# Patient Record
Sex: Male | Born: 2000 | Race: White | Hispanic: No | Marital: Single | Smoking: Never smoker
Health system: Southern US, Community
[De-identification: ages and names within clinical notes are randomized; demographics above are authoritative.]

## PROBLEM LIST (undated history)

## (undated) DIAGNOSIS — F988 Other specified behavioral and emotional disorders with onset usually occurring in childhood and adolescence: Secondary | ICD-10-CM

---
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
## 2021-09-07 ENCOUNTER — Other Ambulatory Visit: Payer: Self-pay

## 2021-09-07 ENCOUNTER — Observation Stay
Admission: EM | Admit: 2021-09-07 | Discharge: 2021-09-08 | Disposition: A | Discharge status: Home / Self Care | Payer: Medicaid Other | Attending: Surgery | Admitting: Surgery

## 2021-09-07 ENCOUNTER — Encounter
Admission: EM | Disposition: A | Discharge status: Home / Self Care | Payer: Self-pay | Source: Home / Self Care | Attending: Emergency Medicine

## 2021-09-07 ENCOUNTER — Emergency Department: Payer: Medicaid Other | Admitting: Anesthesiology

## 2021-09-07 ENCOUNTER — Emergency Department: Payer: Medicaid Other

## 2021-09-07 DIAGNOSIS — K3580 Unspecified acute appendicitis: Secondary | ICD-10-CM | POA: Diagnosis present

## 2021-09-07 HISTORY — PX: XI ROBOTIC LAPAROSCOPIC ASSISTED APPENDECTOMY: SHX6877

## 2021-09-07 LAB — COMPREHENSIVE METABOLIC PANEL
ALT: 13 U/L (ref 0–44)
AST: 20 U/L (ref 15–41)
Albumin: 4.8 g/dL (ref 3.5–5.0)
Alkaline Phosphatase: 88 U/L (ref 38–126)
Anion gap: 9 (ref 5–15)
BUN: 14 mg/dL (ref 6–20)
CO2: 25 mmol/L (ref 22–32)
Calcium: 9.5 mg/dL (ref 8.9–10.3)
Chloride: 101 mmol/L (ref 98–111)
Creatinine, Ser: 0.91 mg/dL (ref 0.61–1.24)
GFR, Estimated: >60 mL/min (ref >60)
Glucose, Bld: 84 mg/dL (ref 70–99)
Potassium: 3.8 mmol/L (ref 3.5–5.1)
Sodium: 135 mmol/L (ref 135–145)
Total Bilirubin: 2.1 mg/dL — ABNORMAL HIGH (ref 0.3–1.2)
Total Protein: 8.1 g/dL (ref 6.5–8.1)

## 2021-09-07 LAB — CBC
HCT: 46.5 % (ref 39.0–52.0)
Hemoglobin: 15.6 g/dL (ref 13.0–17.0)
MCH: 27.7 pg (ref 26.0–34.0)
MCHC: 33.5 g/dL (ref 30.0–36.0)
MCV: 82.6 fL (ref 80.0–100.0)
Platelets: 272 10*3/uL (ref 150–400)
RBC: 5.63 MIL/uL (ref 4.22–5.81)
RDW: 12.1 % (ref 11.5–15.5)
WBC: 18.5 10*3/uL — ABNORMAL HIGH (ref 4.0–10.5)
nRBC: 0 % (ref 0.0–0.2)

## 2021-09-07 LAB — URINALYSIS, ROUTINE W REFLEX MICROSCOPIC
Bacteria, UA: NONE SEEN
Bilirubin Urine: NEGATIVE
Glucose, UA: NEGATIVE mg/dL
Ketones, ur: 20 mg/dL — AB
Leukocytes,Ua: NEGATIVE
Nitrite: NEGATIVE
Protein, ur: NEGATIVE mg/dL
Specific Gravity, Urine: 1.023 (ref 1.005–1.030)
Squamous Epithelial / LPF: NONE SEEN (ref 0–5)
pH: 5 (ref 5.0–8.0)

## 2021-09-07 LAB — LIPASE, BLOOD: Lipase: 25 U/L (ref 11–51)

## 2021-09-07 SURGERY — APPENDECTOMY, ROBOT-ASSISTED, LAPAROSCOPIC
Anesthesia: General

## 2021-09-07 MED ORDER — MORPHINE SULFATE (PF) 2 MG/ML IV SOLN: 1.0000 mg | INTRAVENOUS | Status: DC | PRN

## 2021-09-07 MED ORDER — FENTANYL CITRATE (PF) 100 MCG/2ML IJ SOLN
INTRAMUSCULAR | Status: AC
  Filled 2021-09-07: qty 2

## 2021-09-07 MED ORDER — IOHEXOL 300 MG/ML  SOLN
100.0000 mL | Freq: Once | INTRAMUSCULAR | Status: AC | PRN
  Administered 2021-09-07: 100 mL via INTRAVENOUS

## 2021-09-07 MED ORDER — PROPOFOL 10 MG/ML IV BOLUS
INTRAVENOUS | Status: AC
  Filled 2021-09-07: qty 20

## 2021-09-07 MED ORDER — FENTANYL CITRATE (PF) 100 MCG/2ML IJ SOLN
INTRAMUSCULAR | Status: DC | PRN
  Administered 2021-09-07 (×2): 50 ug via INTRAVENOUS

## 2021-09-07 MED ORDER — FENTANYL CITRATE (PF) 100 MCG/2ML IJ SOLN
25.0000 ug | INTRAMUSCULAR | Status: DC | PRN
  Administered 2021-09-07: 25 ug via INTRAVENOUS

## 2021-09-07 MED ORDER — ONDANSETRON HCL 4 MG/2ML IJ SOLN
INTRAMUSCULAR | Status: DC | PRN
  Administered 2021-09-07: 4 mg via INTRAVENOUS

## 2021-09-07 MED ORDER — IBUPROFEN 400 MG PO TABS: 600.0000 mg | ORAL_TABLET | Freq: Four times a day (QID) | ORAL | Status: DC | PRN

## 2021-09-07 MED ORDER — DEXAMETHASONE SODIUM PHOSPHATE 10 MG/ML IJ SOLN
INTRAMUSCULAR | Status: DC | PRN
  Administered 2021-09-07: 10 mg via INTRAVENOUS

## 2021-09-07 MED ORDER — OXYCODONE HCL 5 MG PO TABS
ORAL_TABLET | ORAL | Status: AC
  Filled 2021-09-07: qty 1

## 2021-09-07 MED ORDER — MIDAZOLAM HCL 2 MG/2ML IJ SOLN
INTRAMUSCULAR | Status: DC | PRN
  Administered 2021-09-07: 2 mg via INTRAVENOUS

## 2021-09-07 MED ORDER — PHENYLEPHRINE 40 MCG/ML (10ML) SYRINGE FOR IV PUSH (FOR BLOOD PRESSURE SUPPORT)
PREFILLED_SYRINGE | INTRAVENOUS | Status: DC | PRN
  Administered 2021-09-07 (×2): 40 ug via INTRAVENOUS
  Administered 2021-09-07: 80 ug via INTRAVENOUS

## 2021-09-07 MED ORDER — SUCCINYLCHOLINE CHLORIDE 200 MG/10ML IV SOSY
PREFILLED_SYRINGE | INTRAVENOUS | Status: DC | PRN
  Administered 2021-09-07: 120 mg via INTRAVENOUS

## 2021-09-07 MED ORDER — ONDANSETRON HCL 4 MG/2ML IJ SOLN: 4.0000 mg | Freq: Four times a day (QID) | INTRAMUSCULAR | Status: DC | PRN

## 2021-09-07 MED ORDER — ACETAMINOPHEN 325 MG PO TABS: 650.0000 mg | ORAL_TABLET | Freq: Four times a day (QID) | ORAL | Status: DC | PRN

## 2021-09-07 MED ORDER — SUGAMMADEX SODIUM 200 MG/2ML IV SOLN
INTRAVENOUS | Status: DC | PRN
  Administered 2021-09-07: 200 mg via INTRAVENOUS

## 2021-09-07 MED ORDER — ACETAMINOPHEN 10 MG/ML IV SOLN
INTRAVENOUS | Status: DC | PRN
Start: 2021-09-07 — End: 2021-09-07
  Administered 2021-09-07: 1000 mg via INTRAVENOUS

## 2021-09-07 MED ORDER — KETOROLAC TROMETHAMINE 30 MG/ML IJ SOLN
INTRAMUSCULAR | Status: DC | PRN
  Administered 2021-09-07: 30 mg via INTRAVENOUS

## 2021-09-07 MED ORDER — DEXMEDETOMIDINE (PRECEDEX) IN NS 20 MCG/5ML (4 MCG/ML) IV SYRINGE
PREFILLED_SYRINGE | INTRAVENOUS | Status: DC | PRN
  Administered 2021-09-07 (×3): 4 ug via INTRAVENOUS

## 2021-09-07 MED ORDER — LACTATED RINGERS IV SOLN: INTRAVENOUS | Status: DC

## 2021-09-07 MED ORDER — HYDROCODONE-ACETAMINOPHEN 5-325 MG PO TABS
1.0000 | ORAL_TABLET | ORAL | Status: DC | PRN
  Administered 2021-09-07 – 2021-09-08 (×2): 1 via ORAL
  Filled 2021-09-07 (×2): qty 1

## 2021-09-07 MED ORDER — LIDOCAINE-EPINEPHRINE (PF) 1 %-1:200000 IJ SOLN
INTRAMUSCULAR | Status: DC | PRN
  Administered 2021-09-07: 10 mL

## 2021-09-07 MED ORDER — CEFAZOLIN SODIUM-DEXTROSE 2-4 GM/100ML-% IV SOLN
2.0000 g | Freq: Once | INTRAVENOUS | Status: AC
  Administered 2021-09-07: 2 g via INTRAVENOUS

## 2021-09-07 MED ORDER — ONDANSETRON 4 MG PO TBDP: 4.0000 mg | ORAL_TABLET | Freq: Four times a day (QID) | ORAL | Status: DC | PRN

## 2021-09-07 MED ORDER — BUPIVACAINE HCL (PF) 0.5 % IJ SOLN
INTRAMUSCULAR | Status: DC | PRN
  Administered 2021-09-07: 10 mL

## 2021-09-07 MED ORDER — MIDAZOLAM HCL 2 MG/2ML IJ SOLN
INTRAMUSCULAR | Status: AC
  Filled 2021-09-07: qty 2

## 2021-09-07 MED ORDER — LACTATED RINGERS IV SOLN: INTRAVENOUS | Status: DC | PRN

## 2021-09-07 MED ORDER — ROCURONIUM BROMIDE 100 MG/10ML IV SOLN
INTRAVENOUS | Status: DC | PRN
  Administered 2021-09-07: 30 mg via INTRAVENOUS
  Administered 2021-09-07: 20 mg via INTRAVENOUS

## 2021-09-07 MED ORDER — OXYCODONE HCL 5 MG/5ML PO SOLN: 5.0000 mg | Freq: Once | ORAL | Status: AC | PRN

## 2021-09-07 MED ORDER — ACETAMINOPHEN 10 MG/ML IV SOLN
INTRAVENOUS | Status: AC
  Filled 2021-09-07: qty 100

## 2021-09-07 MED ORDER — TRAMADOL HCL 50 MG PO TABS
50.0000 mg | ORAL_TABLET | Freq: Four times a day (QID) | ORAL | Status: DC | PRN
  Administered 2021-09-07: 50 mg via ORAL
  Filled 2021-09-07: qty 1

## 2021-09-07 MED ORDER — LORAZEPAM 0.5 MG PO TABS
0.5000 mg | ORAL_TABLET | Freq: Once | ORAL | Status: AC
  Administered 2021-09-07: 0.5 mg via ORAL
  Filled 2021-09-07: qty 1

## 2021-09-07 MED ORDER — LIDOCAINE HCL (CARDIAC) PF 100 MG/5ML IV SOSY
PREFILLED_SYRINGE | INTRAVENOUS | Status: DC | PRN
  Administered 2021-09-07: 80 mg via INTRAVENOUS

## 2021-09-07 MED ORDER — OXYCODONE HCL 5 MG PO TABS
5.0000 mg | ORAL_TABLET | Freq: Once | ORAL | Status: AC | PRN
  Administered 2021-09-07: 5 mg

## 2021-09-07 MED ORDER — 0.9 % SODIUM CHLORIDE (POUR BTL) OPTIME
TOPICAL | Status: DC | PRN
  Administered 2021-09-07: 160 mL

## 2021-09-07 MED ORDER — PROPOFOL 10 MG/ML IV BOLUS
INTRAVENOUS | Status: DC | PRN
  Administered 2021-09-07: 120 mg via INTRAVENOUS

## 2021-09-07 SURGICAL SUPPLY — 69 items
ANCHOR TIS RET SYS 235ML (MISCELLANEOUS) ×2 IMPLANT
BAG INFUSER PRESSURE 100CC (MISCELLANEOUS) ×1 IMPLANT
BLADE SURG SZ11 CARB STEEL (BLADE) ×2 IMPLANT
BULB RESERV EVAC DRAIN JP 100C (MISCELLANEOUS) IMPLANT
CANNULA REDUC XI 12-8 STAPL (CANNULA) ×1
CANNULA REDUCER 12-8 DVNC XI (CANNULA) ×1 IMPLANT
COVER TIP SHEARS 8 DVNC (MISCELLANEOUS) ×1 IMPLANT
COVER TIP SHEARS 8MM DA VINCI (MISCELLANEOUS) ×1
DERMABOND ADVANCED (GAUZE/BANDAGES/DRESSINGS) ×1
DERMABOND ADVANCED .7 DNX12 (GAUZE/BANDAGES/DRESSINGS) ×1 IMPLANT
DRAIN CHANNEL JP 15F RND 16 (MISCELLANEOUS) IMPLANT
DRAPE ARM DVNC X/XI (DISPOSABLE) ×3 IMPLANT
DRAPE COLUMN DVNC XI (DISPOSABLE) ×1 IMPLANT
DRAPE DA VINCI XI ARM (DISPOSABLE) ×3
DRAPE DA VINCI XI COLUMN (DISPOSABLE) ×1
ELECT CAUTERY BLADE 6.4 (BLADE) IMPLANT
ELECT REM PT RETURN 9FT ADLT (ELECTROSURGICAL) ×2
ELECTRODE REM PT RTRN 9FT ADLT (ELECTROSURGICAL) ×1 IMPLANT
GLOVE SURG SYN 6.5 ES PF (GLOVE) ×8 IMPLANT
GLOVE SURG SYN 6.5 PF PI (GLOVE) ×2 IMPLANT
GLOVE SURG UNDER POLY LF SZ7 (GLOVE) ×6 IMPLANT
GOWN STRL REUS W/ TWL LRG LVL3 (GOWN DISPOSABLE) ×3 IMPLANT
GOWN STRL REUS W/TWL LRG LVL3 (GOWN DISPOSABLE) ×3
GRASPER SUT TROCAR 14GX15 (MISCELLANEOUS) ×1 IMPLANT
IRRIGATOR SUCT 8 DISP DVNC XI (IRRIGATION / IRRIGATOR) IMPLANT
IRRIGATOR SUCTION 8MM XI DISP (IRRIGATION / IRRIGATOR) ×1
IV NS 1000ML (IV SOLUTION) ×1
IV NS 1000ML BAXH (IV SOLUTION) IMPLANT
JACKSON PRATT 7MM (INSTRUMENTS) IMPLANT
KIT TURNOVER KIT A (KITS) ×2 IMPLANT
LABEL OR SOLS (LABEL) IMPLANT
MANIFOLD NEPTUNE II (INSTRUMENTS) ×2 IMPLANT
NDL INSUFFLATION 14GA 120MM (NEEDLE) ×1 IMPLANT
NEEDLE HYPO 22GX1.5 SAFETY (NEEDLE) ×2 IMPLANT
NEEDLE INSUFFLATION 14GA 120MM (NEEDLE) ×2 IMPLANT
OBTURATOR OPTICAL STANDARD 8MM (TROCAR) ×1
OBTURATOR OPTICAL STND 8 DVNC (TROCAR) ×1
OBTURATOR OPTICALSTD 8 DVNC (TROCAR) ×1 IMPLANT
PACK LAP CHOLECYSTECTOMY (MISCELLANEOUS) ×2 IMPLANT
PENCIL ELECTRO HAND CTR (MISCELLANEOUS) ×2 IMPLANT
RELOAD STAPLE 45 2.5 WHT DVNC (STAPLE) IMPLANT
RELOAD STAPLE 45 3.5 BLU DVNC (STAPLE) ×1 IMPLANT
RELOAD STAPLER 2.5X45 WHT DVNC (STAPLE) ×2 IMPLANT
RELOAD STAPLER 3.5X45 BLU DVNC (STAPLE) ×1 IMPLANT
SEAL CANN UNIV 5-8 DVNC XI (MISCELLANEOUS) ×3 IMPLANT
SEAL XI 5MM-8MM UNIVERSAL (MISCELLANEOUS) ×3
SET TUBE SMOKE EVAC HIGH FLOW (TUBING) ×2 IMPLANT
SOLUTION ELECTROLUBE (MISCELLANEOUS) ×2 IMPLANT
STAPLER 45 DA VINCI SURE FORM (STAPLE) ×1
STAPLER 45 SUREFORM DVNC (STAPLE) ×1 IMPLANT
STAPLER CANNULA SEAL DVNC XI (STAPLE) ×1 IMPLANT
STAPLER CANNULA SEAL XI (STAPLE) ×1
STAPLER RELOAD 2.5X45 WHITE (STAPLE) ×2
STAPLER RELOAD 2.5X45 WHT DVNC (STAPLE) ×2
STAPLER RELOAD 3.5X45 BLU DVNC (STAPLE) ×1
STAPLER RELOAD 3.5X45 BLUE (STAPLE) ×1
SUT ETHILON 3-0 FS-10 30 BLK (SUTURE)
SUT MNCRL 4-0 (SUTURE) ×2
SUT MNCRL 4-0 27XMFL (SUTURE) ×2
SUT MNCRL AB 4-0 PS2 18 (SUTURE) ×2 IMPLANT
SUT VIC AB 3-0 SH 27 (SUTURE) ×2
SUT VIC AB 3-0 SH 27X BRD (SUTURE) IMPLANT
SUT VIC AB 4-0 PS2 27 (SUTURE) IMPLANT
SUT VICRYL 0 AB UR-6 (SUTURE) ×2 IMPLANT
SUTURE EHLN 3-0 FS-10 30 BLK (SUTURE) IMPLANT
SUTURE MNCRL 4-0 27XMF (SUTURE) IMPLANT
SYR 30ML LL (SYRINGE) ×2 IMPLANT
SYSTEM WECK SHIELD CLOSURE (TROCAR) IMPLANT
WATER STERILE IRR 500ML POUR (IV SOLUTION) ×2 IMPLANT

## 2021-09-07 NOTE — Interval H&P Note (Signed)
History and Physical Interval Note: ? ?09/07/2021 ?3:24 PM ? ?Cory Keller  has presented today for surgery, with the diagnosis of Appendicitis.  The various methods of treatment have been discussed with the patient and family. After consideration of risks, benefits and other options for treatment, the patient has consented to  Procedure(s): ?XI ROBOTIC [place] (N/A) as a surgical intervention.  The patient's history has been reviewed, patient examined, no change in status, stable for surgery.  I have reviewed the patient's chart and labs.  Questions were answered to the patient's satisfaction.   ? ? ?Cory Keller ? ? ?

## 2021-09-07 NOTE — Anesthesia Procedure Notes (Signed)
Procedure Name: Intubation ?Date/Time: 09/07/2021 3:35 PM ?Performed by: Joanette Gula, Catera Hankins, CRNA ?Pre-anesthesia Checklist: Patient identified, Emergency Drugs available, Suction available and Patient being monitored ?Patient Re-evaluated:Patient Re-evaluated prior to induction ?Oxygen Delivery Method: Circle system utilized ?Preoxygenation: Pre-oxygenation with 100% oxygen ?Induction Type: IV induction ?Ventilation: Mask ventilation without difficulty ?Laryngoscope Size: McGraph and 4 ?Grade View: Grade I ?Tube type: Oral ?Number of attempts: 1 ?Airway Equipment and Method: Stylet ?Placement Confirmation: ETT inserted through vocal cords under direct vision, positive ETCO2 and breath sounds checked- equal and bilateral ?Secured at: 22 cm ?Tube secured with: Tape ?Dental Injury: Teeth and Oropharynx as per pre-operative assessment  ? ? ? ? ?

## 2021-09-07 NOTE — ED Provider Notes (Signed)
? ?  [facility] ?Provider Note ? ? ? Event Date/Time  ? First MD Initiated Contact with Patient 09/07/21 1137   ?  (approximate) ? ?History  ? ?Chief Complaint: Abdominal Pain ? ?HPI ? ?Cory Keller is a 21 y.o. male with no significant past medical history presents to the emergency department for 3 days of lower abdominal pain.  According to the patient for the past few days he has been experiencing pain across the lower abdomen along with diarrhea.  Denies any vomiting or fever.  No dysuria or hematuria. ? ?Physical Exam  ? ?Triage Vital Signs: ?ED Triage Vitals  ?Enc Vitals Group  ?   BP 09/07/21 1119 137/74  ?   Pulse Rate 09/07/21 1119 99  ?   Resp 09/07/21 1119 18  ?   Temp 09/07/21 1119 98.3 ?F (36.8 ?C)  ?   Temp Source 09/07/21 1119 Oral  ?   SpO2 09/07/21 1119 98 %  ?   Weight --   ?   Height --   ?   Head Circumference --   ?   Peak Flow --   ?   Pain Score 09/07/21 1124 7  ?   Pain Loc --   ?   Pain Edu? --   ?   Excl. in GC? --   ? ? ?Most recent vital signs: ?Vitals:  ? 09/07/21 1119  ?BP: 137/74  ?Pulse: 99  ?Resp: 18  ?Temp: 98.3 ?F (36.8 ?C)  ?SpO2: 98%  ? ? ?General: Awake, no distress.  ?CV:  Good peripheral perfusion.  Regular rate and rhythm  ?Resp:  Normal effort.  Equal breath sounds bilaterally.  ?Abd:  No distention.  Soft, moderate tenderness across the lower abdomen.  No rebound guarding or distention ? ? ?ED Results / Procedures / Treatments  ? ?RADIOLOGY ? ?I personally reviewed the CT images concern for possible acute appendicitis given the fluid-filled appendix what appears to be an appendix in the right lower quadrant. ? ?IMPRESSION:  ?1.  Acute appendicitis without evidence of perforation or abscess.  ? ?2.  Small volume free fluid in the low pelvis, likely reactive.  ? ? ?MEDICATIONS ORDERED IN ED: ?Medications - No data to display ? ? ?IMPRESSION / MDM / ASSESSMENT AND PLAN / ED COURSE  ?I reviewed the triage vital signs and the nursing notes. ? ?Patient  presents emergency department for 3 days of lower abdominal pain, moderate tenderness on my evaluation.  Patient does state intermittent loose stool but denies any vomiting or fever.  We will obtain lab work and a CT scan to further evaluate.  Differential would include appendicitis, colitis, diverticulitis, UTI/pyelonephritis. ? ?Patient's CBC is resulted showing moderate leukocytosis of 18,500.  Remainder the lab work is pending.  CT pending.  Patient is quite anxious appearing, we will dose small dose of Ativan once IV established so that we can obtain CT imaging. ? ?CT scan confirms acute appendicitis.  I spoke to Dr. Tonna Boehringer will be down shortly to speak to the patient after he is out of the operating room. ? ?FINAL CLINICAL IMPRESSION(S) / ED DIAGNOSES  ? ?Abdominal pain ?Acute appendicitis ? ?Note:  This document was prepared using Dragon voice recognition software and may include unintentional dictation errors. ?  Minna Antis, MD ?09/07/21 1403 ? ?

## 2021-09-07 NOTE — Anesthesia Preprocedure Evaluation (Signed)
Anesthesia Evaluation  ?Patient identified by MRN, date of birth, ID band ?Patient awake ? ? ? ?Reviewed: ?Allergy & Precautions, NPO status , Patient's Chart, lab work & pertinent test results ? ?History of Anesthesia Complications ?Negative for: history of anesthetic complications ? ?Airway ?Mallampati: II ? ?TM Distance: >3 FB ?Neck ROM: full ? ? ? Dental ? ?(+) Chipped ?  ?Pulmonary ?neg pulmonary ROS, neg shortness of breath,  ?  ?Pulmonary exam normal ? ? ? ? ? ? ? Cardiovascular ?Exercise Tolerance: Good ?(-) angina(-) Past MI and (-) DOE negative cardio ROS ?Normal cardiovascular exam ? ? ?  ?Neuro/Psych ?negative neurological ROS ? negative psych ROS  ? GI/Hepatic ?negative GI ROS, Neg liver ROS, neg GERD  ,  ?Endo/Other  ?negative endocrine ROS ? Renal/GU ?  ? ?  ?Musculoskeletal ? ? Abdominal ?  ?Peds ? Hematology ?negative hematology ROS ?(+)   ?Anesthesia Other Findings ?Past Medical History: ?No date: ADD (attention deficit disorder) ? ?History reviewed. No pertinent surgical history. ? ? ? ? Reproductive/Obstetrics ?negative OB ROS ? ?  ? ? ? ? ? ? ? ? ? ? ? ? ? ?  ?  ? ? ? ? ? ? ? ? ?Anesthesia Physical ?Anesthesia Plan ? ?ASA: 2 ? ?Anesthesia Plan: General ETT  ? ?Post-op Pain Management:   ? ?Induction: Intravenous ? ?PONV Risk Score and Plan: Ondansetron, Dexamethasone, Midazolam and Treatment may vary due to age or medical condition ? ?Airway Management Planned: Oral ETT ? ?Additional Equipment:  ? ?Intra-op Plan:  ? ?Post-operative Plan: Extubation in OR ? ?Informed Consent: I have reviewed the patients History and Physical, chart, labs and discussed the procedure including the risks, benefits and alternatives for the proposed anesthesia with the patient or authorized representative who has indicated his/her understanding and acceptance.  ? ? ? ?Dental Advisory Given ? ?Plan Discussed with: Anesthesiologist, CRNA and Surgeon ? ?Anesthesia Plan Comments: (Patient  and mother consented for risks of anesthesia including but not limited to:  ?- adverse reactions to medications ?- damage to eyes, teeth, lips or other oral mucosa ?- nerve damage due to positioning  ?- sore throat or hoarseness ?- Damage to heart, brain, nerves, lungs, other parts of body or loss of life ? ?They voiced understanding.)  ? ? ? ? ? ? ?Anesthesia Quick Evaluation ? ?

## 2021-09-07 NOTE — Anesthesia Postprocedure Evaluation (Signed)
Anesthesia Post Note ? ?Patient: Cory Keller ? ?Procedure(s) Performed: XI ROBOTIC LAPAROSCOPIC ASSISTED APPENDECTOMY ? ?Patient location during evaluation: PACU ?Anesthesia Type: General ?Level of consciousness: awake and alert ?Pain management: pain level controlled ?Vital Signs Assessment: post-procedure vital signs reviewed and stable ?Respiratory status: spontaneous breathing, nonlabored ventilation, respiratory function stable and patient connected to nasal cannula oxygen ?Cardiovascular status: blood pressure returned to baseline and stable ?Postop Assessment: no apparent nausea or vomiting ?Anesthetic complications: no ? ? ?No notable events documented. ? ? ?Last Vitals:  ?Vitals:  ? 09/07/21 1830 09/07/21 1845  ?BP: (!) 94/56 (!) 95/53  ?Pulse: 64 63  ?Resp: 16 15  ?Temp:    ?SpO2: 100% 100%  ?  ?Last Pain:  ?Vitals:  ? 09/07/21 1830  ?TempSrc:   ?PainSc: Asleep  ? ? ?  ?  ?  ?  ?  ?  ? ?Precious Haws [name] ? ? ? ? ?

## 2021-09-07 NOTE — ED Triage Notes (Signed)
Pt here with abd pain that started 4 days ago. Pt states pain is lower abd and does not radiate. Pt has diarrhea and nausea but denies emesis. Pt stable in triage. ?

## 2021-09-07 NOTE — ED Notes (Signed)
Patient's mom, Anderson [phone] ?

## 2021-09-07 NOTE — ED Notes (Signed)
Patient transported to CT 

## 2021-09-07 NOTE — ED Notes (Signed)
Patient's mom updated about patient status.  ?

## 2021-09-07 NOTE — Op Note (Signed)
Preoperative diagnosis: acute appendicitis ? ?Postoperative diagnosis: Same ? ?Procedure: Robotic assisted laparoscopic appendectomy. ? ?Anesthesia: GETA ? ?Surgeon: Benjamine Sprague ? ?Wound Classification: clean contaminated ? ?Specimen: Appendix ? ?Complications: None ? ?Estimated Blood Loss: 3 mL ? ? ?Indications: Patient is a 21 y.o. male  presented with above.  Please see H&P for further details.   ? ?FIndings: ?1.  Irritated appendix  ?2. No peri-appendiceal abscess or phlegmon ?3. Normal anatomy ?4. Appendiceal artery ligated and divided with stapler ?5. Adequate hemostasis.  ? ?Description of procedure: The patient was placed on the operating table in the supine position, left arm tucked. General anesthesia was induced. A time-out was completed verifying correct patient, procedure, site, positioning, and implant(s) and/or special equipment prior to beginning this procedure. The abdomen was prepped and draped in the usual sterile fashion.  ? ?Palmer's point located and Veress needle was inserted.  After confirming 2 clicks and a positive saline drop test, gas insufflation was initiated until the abdominal pressure was measured at 15 mmHg.  Afterwards, the Veress needle was removed and a 8 mm port was placed through a periumbilical site using Optiview technique after incision with an 11 blade.  After local was infused, 2 additional incision was made 8 cm apart each side along the left side of the abdominal wall from the initial incision.  An 8 mm port was caudaed and 59mm port cephalad from initial incision, both under direct visualization.  No injuries from trocar placements were noted. The table was placed in the Trendelenburg position with the right side elevated.  Xi robotic platform was then brought to the operative field and docked. ? ?An inflamed appendix was identified and elevated.  Infection was present within the abdominal cavity due to appendicitis. Window created at base of appendix in the mesentery.    ?A blue load linear cutting stapler was then used to divide and staple the base of the appendix. It was reloaded with a vascular cartridge and the mesoappendix similarly divided x2.  No bleeding from the staple lines noted.  The appendix was placed in an endoscopic retrieval bag and removed.  ? ?The appendiceal stump and mesoappendix staple line examined again and hemostasis noted. No other pathology was identified within pelvis. The 12 mm trocar removed and port site closed with PMI using 0 vicryl under direct vision. Remaining trocars were removed under direct vision. No bleeding was noted.The abdomen was allowed to collapse. 3-0 vicryl interrupted used to close 70mm port site at dermal level, and then all skin incisions then closed with subcuticular sutures Monocryl 4-0.  Wounds then dressed with dermabond. ? ?The patient tolerated the procedure well, awakened from anesthesia and was taken to the postanesthesia care unit in satisfactory condition.  Sponge count and instrument count correct at the end of the procedure. ? ?

## 2021-09-07 NOTE — H&P (Signed)
Subjective:  ? ?CC: acute appendicitis ? ?HPI: ? Cory Keller is a 21 y.o. male who is consulted by [facility] for evaluation of  above cc.  Symptoms were first noted 3 days ago. Pain is sharp, RLQ  Associated with nothing, exacerbated by nothing. ?    ?Past Medical History:  has a past medical history of ADD (attention deficit disorder). ? ?Past Surgical History: none reportred ? ?Family History: reviewed and not relevant to CC ? ?Social History:  reports that he has never smoked. He has never used smokeless tobacco. He reports that he does not drink alcohol. No history on file for drug use. ? ?Current Medications:  ?Prior to Admission medications   ?Medication Sig Start Date End Date Taking? Authorizing Provider  ?ISOtretinoin (ACCUTANE) 40 MG capsule Take 1 capsule by mouth daily. ?Patient not taking: Reported on 09/07/2021 03/17/19   [provider]  ?lisdexamfetamine (VYVANSE) 20 MG capsule Take 20 mg by mouth daily. ?Patient not taking: Reported on 09/07/2021    [provider]  ?predniSONE (DELTASONE) 10 MG tablet 50 mg daily x 4 days, then 40 mg daily x 4 days, then 30 mg daily x 4 days, then 20 mg daily x 4 days, then 10 mg daily x 4 days. ?Patient not taking: Reported on 09/07/2021 09/19/17   Coral Spikes, DO  ? ? ?Allergies:  ?Allergies as of 09/07/2021  ? (No Known Allergies)  ? ? ?ROS:  ?General: Denies weight loss, weight gain, fatigue, fevers, chills, and night sweats. ?Eyes: Denies blurry vision, double vision, eye pain, itchy eyes, and tearing. ?Ears: Denies hearing loss, earache, and ringing in ears. ?Nose: Denies sinus pain, congestion, infections, runny nose, and nosebleeds. ?Mouth/throat: Denies hoarseness, sore throat, bleeding gums, and difficulty swallowing. ?Heart: Denies chest pain, palpitations, racing heart, irregular heartbeat, leg pain or swelling, and decreased activity tolerance. ?Respiratory: Denies breathing difficulty, shortness of breath, wheezing, cough, and  sputum. ?GI: Denies change in appetite, heartburn, nausea, vomiting, constipation, diarrhea, and blood in stool. ?GU: Denies difficulty urinating, pain with urinating, urgency, frequency, blood in urine. ?Musculoskeletal: Denies joint stiffness, pain, swelling, muscle weakness. ?Skin: Denies rash, itching, mass, tumors, sores, and boils ?Neurologic: Denies headache, fainting, dizziness, seizures, numbness, and tingling. ?Psychiatric: Denies depression, anxiety, difficulty sleeping, and memory loss. ?Endocrine: Denies heat or cold intolerance, and increased thirst or urination. ?Blood/lymph: Denies easy bruising, easy bruising, and swollen glands ? ? ?  ?Objective:  ?  ? ?BP 131/73   Pulse (!) 106   Temp 98.3 ?F (36.8 ?C) (Oral)   Resp 18   SpO2 98%  ? ? ?Constitutional :  alert, cooperative, appears stated age, and no distress  ?Lymphatics/Throat:  no asymmetry, masses, or scars  ?Respiratory:  clear to auscultation bilaterally  ?Cardiovascular:  regular rate and rhythm  ?Gastrointestinal: Soft, no guarding, TTP in RLQ as expected .   ?Musculoskeletal: Steady gait and movement  ?Skin: Cool and moist, no surgical scars  ?Psychiatric: Normal affect, non-agitated, not confused  ?   ?  ?LABS:  ? ?  Latest Ref Rng & Units 09/07/2021  ? 11:22 AM  ?CMP  ?Glucose 70 - 99 mg/dL 84    ?BUN 6 - 20 mg/dL 14    ?Creatinine 0.61 - 1.24 mg/dL 0.91    ?Sodium 135 - 145 mmol/L 135    ?Potassium 3.5 - 5.1 mmol/L 3.8    ?Chloride 98 - 111 mmol/L 101    ?CO2 22 - 32 mmol/L 25    ?  Calcium 8.9 - 10.3 mg/dL 9.5    ?Total Protein 6.5 - 8.1 g/dL 8.1    ?Total Bilirubin 0.3 - 1.2 mg/dL 2.1    ?Alkaline Phos 38 - 126 U/L 88    ?AST 15 - 41 U/L 20    ?ALT 0 - 44 U/L 13    ? ? ?  Latest Ref Rng & Units 09/07/2021  ? 11:22 AM  ?CBC  ?WBC 4.0 - 10.5 K/uL 18.5    ?Hemoglobin 13.0 - 17.0 g/dL 15.6    ?Hematocrit 39.0 - 52.0 % 46.5    ?Platelets 150 - 400 K/uL 272    ? ? ? ?RADS: ?CLINICAL DATA:  Right lower quadrant abdominal pain for 4 days ?   ?EXAM: ?CT ABDOMEN AND PELVIS WITH CONTRAST ?  ?TECHNIQUE: ?Multidetector CT imaging of the abdomen and pelvis was performed ?using the standard protocol following bolus administration of ?intravenous contrast. ?  ?RADIATION DOSE REDUCTION: This exam was performed according to the ?departmental dose-optimization program which includes automated ?exposure control, adjustment of the mA and/or kV according to ?patient size and/or use of iterative reconstruction technique. ?  ?CONTRAST:  188mL OMNIPAQUE IOHEXOL 300 MG/ML  SOLN ?  ?COMPARISON:  None. ?  ?FINDINGS: ?Lower chest: No acute abnormality. ?  ?Hepatobiliary: No solid liver abnormality is seen. No gallstones, ?gallbladder wall thickening, or biliary dilatation. ?  ?Pancreas: Unremarkable. No pancreatic ductal dilatation or ?surrounding inflammatory changes. ?  ?Spleen: Normal in size without significant abnormality. ?  ?Adrenals/Urinary Tract: Adrenal glands are unremarkable. Kidneys are ?normal, without renal calculi, solid lesion, or hydronephrosis. ?Bladder is unremarkable. ?  ?Stomach/Bowel: Stomach is within normal limits. The appendix is ?fluid filled and distended with mucosal hyperenhancement, measuring ?up to 1.2 cm in caliber (series 2, image 58). Extensive adjacent fat ?stranding. No evidence of perforation or abscess. No evidence of ?bowel wall thickening, distention, or inflammatory changes. ?  ?Vascular/Lymphatic: No significant vascular findings are present. No ?enlarged abdominal or pelvic lymph nodes. ?  ?Reproductive: No mass or other significant abnormality. ?  ?Other: No abdominal wall hernia or abnormality. Small volume free ?fluid in the low pelvis. ?  ?Musculoskeletal: No acute or significant osseous findings. ?  ?IMPRESSION: ?1.  Acute appendicitis without evidence of perforation or abscess. ?  ?2.  Small volume free fluid in the low pelvis, likely reactive. ?  ?  ?Electronically Signed ?  By: Delanna Ahmadi M.D. ?  On: 09/07/2021  12:49 ?Assessment:  ? ?   ?Acute appendicitis ? ?Plan:  ? ?  ? ?Discussed the risk of surgery including post-op infxn, seroma, hematoma, abscess formation, chronic pain, poor-delayed wound healing, possible bowel resection, possible ostomy, possible conversion to open procedure, post-op SBO or ileus, and need for additional procedures to address said risks.  The risks of general anesthetic including MI, CVA, sudden death or even reaction to anesthetic medications also discussed. Alternatives include continued observation, or antibiotic treatment.  Benefits include possible symptom relief,  ? ?Typical post operative recovery of 3-5 days rest, also discussed. ? ?The patient understands the risks, any and all questions were answered to the patient's satisfaction. ? ?To OR for robotic assisted lap appy ? ?labs/images/medications/previous chart entries reviewed personally and relevant changes/updates noted above. ? ?

## 2021-09-07 NOTE — Transfer of Care (Signed)
Immediate Anesthesia Transfer of Care Note ? ?Patient: Cory Keller ? ?Procedure(s) Performed: XI ROBOTIC LAPAROSCOPIC ASSISTED APPENDECTOMY ? ?Patient Location: PACU ? ?Anesthesia Type:General ? ?Level of Consciousness: drowsy ? ?Airway & Oxygen Therapy: Patient Spontanous Breathing and Patient connected to face mask oxygen ? ?Post-op Assessment: Report given to RN and Post -op Vital signs reviewed and stable ? ?Post vital signs: Reviewed and stable ? ?Last Vitals:  ?Vitals Value Taken Time  ?BP 120/64   ?Temp    ?Pulse 75 09/07/21 1725  ?Resp 16 09/07/21 1725  ?SpO2 100 % 09/07/21 1725  ?Vitals shown include unvalidated device data. ? ?Last Pain:  ?Vitals:  ? 09/07/21 1513  ?TempSrc: Tympanic  ?PainSc: 7   ?   ? ?  ? ?Complications: No notable events documented. ?

## 2021-09-08 ENCOUNTER — Encounter: Payer: Self-pay | Admitting: Surgery

## 2021-09-08 LAB — CBC
HCT: 40 % (ref 39.0–52.0)
Hemoglobin: 13.8 g/dL (ref 13.0–17.0)
MCH: 28.3 pg (ref 26.0–34.0)
MCHC: 34.5 g/dL (ref 30.0–36.0)
MCV: 82.1 fL (ref 80.0–100.0)
Platelets: 251 10*3/uL (ref 150–400)
RBC: 4.87 MIL/uL (ref 4.22–5.81)
RDW: 11.9 % (ref 11.5–15.5)
WBC: 19.3 10*3/uL — ABNORMAL HIGH (ref 4.0–10.5)
nRBC: 0 % (ref 0.0–0.2)

## 2021-09-08 LAB — BASIC METABOLIC PANEL
Anion gap: 7 (ref 5–15)
BUN: 14 mg/dL (ref 6–20)
CO2: 25 mmol/L (ref 22–32)
Calcium: 9 mg/dL (ref 8.9–10.3)
Chloride: 103 mmol/L (ref 98–111)
Creatinine, Ser: 0.67 mg/dL (ref 0.61–1.24)
GFR, Estimated: >60 mL/min (ref >60)
Glucose, Bld: 143 mg/dL — ABNORMAL HIGH (ref 70–99)
Potassium: 4.6 mmol/L (ref 3.5–5.1)
Sodium: 135 mmol/L (ref 135–145)

## 2021-09-08 MED ORDER — IBUPROFEN 800 MG PO TABS: 800.0000 mg | ORAL_TABLET | Freq: Three times a day (TID) | ORAL | 0 refills | Status: DC | PRN

## 2021-09-08 MED ORDER — OXYCODONE-ACETAMINOPHEN 5-325 MG PO TABS: 1.0000 | ORAL_TABLET | Freq: Three times a day (TID) | ORAL | 0 refills | Status: DC | PRN

## 2021-09-08 MED ORDER — DOCUSATE SODIUM 100 MG PO CAPS: 100.0000 mg | ORAL_CAPSULE | Freq: Two times a day (BID) | ORAL | 0 refills | Status: AC | PRN

## 2021-09-08 MED ORDER — ACETAMINOPHEN 325 MG PO TABS: 650.0000 mg | ORAL_TABLET | Freq: Three times a day (TID) | ORAL | 0 refills | Status: AC | PRN

## 2021-09-08 NOTE — Plan of Care (Signed)

## 2021-09-08 NOTE — Progress Notes (Signed)
Patient discharged with all belongings to home via family transport. Patient given discharge instructions with a full understanding of wound care and medication regimen. Patient expressed understanding and has no follow-up questions or concerns.  ?

## 2021-09-08 NOTE — Discharge Summary (Signed)
Physician Discharge Summary  ?Patient ID: ?Cory Keller ?MRN: [id number] ?DOB/AGE: [birth year] 20 y.o. ? ?Admit date: 09/07/2021 ?Discharge date: September 08, 2021 ? ?Admission Diagnoses: Acute appendicitis ? ?Discharge Diagnoses:  ?Same as above ? ?Discharged Condition: good ? ?Hospital Course: admitted for above. Underwent surgery.  Please see op note for details.  Post op, recovered as expected.  At time of d/c, tolerating diet and pain controlled ? ?Consults: None ? ?Discharge Exam: ?Blood pressure (!) 100/58, pulse 64, temperature (!) 97.3 ?F (36.3 ?C), temperature source Oral, resp. rate 20, height 5' 3.5" (1.613 m), weight 47.6 kg, SpO2 100 %. ?General appearance: alert, cooperative, and no distress ?GI: Soft, no guarding, appropriate tenderness to palpation at incision sites which are clean dry and intact. ? ?Disposition:  ?Discharge disposition: 01-Home or Self Care ? ? ? ? ? ? ? ?Allergies as of 09/08/2021   ?No Known Allergies ?  ? ?  ?Medication List  ?  ? ?STOP taking these medications   ? ?ISOtretinoin 40 MG capsule ?Commonly known as: ACCUTANE ?  ?lisdexamfetamine 20 MG capsule ?Commonly known as: VYVANSE ?  ?predniSONE 10 MG tablet ?Commonly known as: DELTASONE ?  ? ?  ? ?TAKE these medications   ? ?acetaminophen 325 MG tablet ?Commonly known as: Tylenol ?Take 2 tablets (650 mg total) by mouth every 8 (eight) hours as needed for mild pain. ?  ?docusate sodium 100 MG capsule ?Commonly known as: Colace ?Take 1 capsule (100 mg total) by mouth 2 (two) times daily as needed for up to 10 days for mild constipation. ?  ?ibuprofen 800 MG tablet ?Commonly known as: ADVIL ?Take 1 tablet (800 mg total) by mouth every 8 (eight) hours as needed for mild pain or moderate pain. ?  ?oxyCODONE-acetaminophen 5-325 MG tablet ?Commonly known as: Percocet ?Take 1 tablet by mouth every 8 (eight) hours as needed for severe pain. ?  ? ?  ? ? Follow-up Information   ? ? Burtis Imhoff, DO. Go on 09/25/2021.   ?Specialty: Surgery ?Why:  post op lap appy appointment 8:45am ?Contact information: ?[address] ?[place] [address] ?[phone] ? ? ?  ?  ? ?  ?  ? ?  ? ? ? ?Total time spent arranging discharge was >66min. ?Signed: ?Benjamine Sprague ?09/08/2021, 1:28 PM ? ?

## 2021-09-08 NOTE — Discharge Instructions (Signed)
Laparoscopic Appendectomy, Care After This sheet gives you information about how to care for yourself after your procedure. Your doctor may also give you more specific instructions. If you have problems or questions, contact your doctor. Follow these instructions at home: Care for cuts from surgery (incisions)  Follow instructions from your doctor about how to take care of your cuts from surgery. Make sure you: Wash your hands with soap and water before you change your bandage (dressing). If you cannot use soap and water, use hand sanitizer. Change your bandage as told by your doctor. Leave stitches (sutures), skin glue, or skin tape (adhesive) strips in place. They may need to stay in place for 2 weeks or longer. If tape strips get loose and curl up, you may trim the loose edges. Do not remove tape strips completely unless your doctor says it is okay. Do not take baths, swim, or use a hot tub until your doctor says it is okay. OK TO SHOWER 24HRS AFTER YOUR SURGERY.  Check your surgical cut area every day for signs of infection. Check for: More redness, swelling, or pain. More fluid or blood. Warmth. Pus or a bad smell. Activity Do not drive or use heavy machinery while taking prescription pain medicine. Do not play contact sports until your doctor says it is okay. Do not drive for 24 hours if you were given a medicine to help you relax (sedative). Rest as needed. Do not return to work or school until your doctor says it is okay. General instructions  tylenol and advil as needed for discomfort.  Please alternate between the two every four hours as needed for pain.    Use narcotics, if prescribed, only when tylenol and motrin is not enough to control pain.  325-650mg every 8hrs to max of 3000mg/24hrs (including the 325mg in every norco dose) for the tylenol.    Advil up to 800mg per dose every 8hrs as needed for pain.   To prevent or treat constipation while you are taking prescription pain  medicine, your doctor may recommend that you: Drink enough fluid to keep your pee (urine) clear or pale yellow. Take over-the-counter or prescription medicines. Eat foods that are high in fiber, such as fresh fruits and vegetables, whole grains, and beans. Limit foods that are high in fat and processed sugars, such as fried and sweet foods. Contact a doctor if: You develop a rash. You have more redness, swelling, or pain around your surgical cuts. You have more fluid or blood coming from your surgical cuts. Your surgical cuts feel warm to the touch. You have pus or a bad smell coming from your surgical cuts. You have a fever. One or more of your surgical cuts breaks open. Get help right away if: You have trouble breathing. You have chest pain. You faint or feel dizzy when you stand. You have leg pain. This information is not intended to replace advice given to you by your health care provider. Make sure you discuss any questions you have with your health care provider. Document Released: 03/06/2008 Document Revised: 12/17/2015 Document Reviewed: 11/14/2015 Elsevier Interactive Patient Education  2019 [employer]  

## 2021-09-08 NOTE — Plan of Care (Signed)
?  Problem: Education: ?Goal: Knowledge of General Education information will improve ?Description: Including pain rating scale, medication(s)/side effects and non-pharmacologic comfort measures ?09/08/2021 0951 by Evelena Peat, RN ?Outcome: Completed/Met ?09/08/2021 0951 by Evelena Peat, RN ?Outcome: Progressing ?  ?Problem: Health Behavior/Discharge Planning: ?Goal: Ability to manage health-related needs will improve ?09/08/2021 0951 by Evelena Peat, RN ?Outcome: Completed/Met ?09/08/2021 0951 by Evelena Peat, RN ?Outcome: Progressing ?  ?Problem: Clinical Measurements: ?Goal: Ability to maintain clinical measurements within normal limits will improve ?09/08/2021 0951 by Evelena Peat, RN ?Outcome: Completed/Met ?09/08/2021 0951 by Evelena Peat, RN ?Outcome: Progressing ?Goal: Will remain free from infection ?09/08/2021 0951 by Evelena Peat, RN ?Outcome: Completed/Met ?09/08/2021 0951 by Evelena Peat, RN ?Outcome: Progressing ?Goal: Diagnostic test results will improve ?09/08/2021 0951 by Evelena Peat, RN ?Outcome: Completed/Met ?09/08/2021 0951 by Evelena Peat, RN ?Outcome: Progressing ?Goal: Respiratory complications will improve ?09/08/2021 0951 by Evelena Peat, RN ?Outcome: Completed/Met ?09/08/2021 0951 by Evelena Peat, RN ?Outcome: Progressing ?Goal: Cardiovascular complication will be avoided ?09/08/2021 0951 by Evelena Peat, RN ?Outcome: Completed/Met ?09/08/2021 0951 by Evelena Peat, RN ?Outcome: Progressing ?  ?Problem: Activity: ?Goal: Risk for activity intolerance will decrease ?09/08/2021 0951 by Evelena Peat, RN ?Outcome: Completed/Met ?09/08/2021 0951 by Evelena Peat, RN ?Outcome: Progressing ?  ?Problem: Nutrition: ?Goal: Adequate nutrition will be maintained ?09/08/2021 0951 by Evelena Peat, RN ?Outcome: Completed/Met ?09/08/2021 0951 by Evelena Peat, RN ?Outcome: Progressing ?  ?Problem: Coping: ?Goal: Level  of anxiety will decrease ?09/08/2021 0951 by Evelena Peat, RN ?Outcome: Completed/Met ?09/08/2021 0951 by Evelena Peat, RN ?Outcome: Progressing ?  ?Problem: Elimination: ?Goal: Will not experience complications related to bowel motility ?09/08/2021 0951 by Evelena Peat, RN ?Outcome: Completed/Met ?09/08/2021 0951 by Evelena Peat, RN ?Outcome: Progressing ?Goal: Will not experience complications related to urinary retention ?09/08/2021 0951 by Evelena Peat, RN ?Outcome: Completed/Met ?09/08/2021 0951 by Evelena Peat, RN ?Outcome: Progressing ?  ?Problem: Pain Managment: ?Goal: General experience of comfort will improve ?09/08/2021 0951 by Evelena Peat, RN ?Outcome: Completed/Met ?09/08/2021 0951 by Evelena Peat, RN ?Outcome: Progressing ?  ?Problem: Safety: ?Goal: Ability to remain free from injury will improve ?09/08/2021 0951 by Evelena Peat, RN ?Outcome: Completed/Met ?09/08/2021 0951 by Evelena Peat, RN ?Outcome: Progressing ?  ?Problem: Skin Integrity: ?Goal: Risk for impaired skin integrity will decrease ?09/08/2021 0951 by Evelena Peat, RN ?Outcome: Completed/Met ?09/08/2021 0951 by Evelena Peat, RN ?Outcome: Progressing ?  ?Problem: Education: ?Goal: Required Educational Video(s) ?09/08/2021 0951 by Evelena Peat, RN ?Outcome: Completed/Met ?09/08/2021 0951 by Evelena Peat, RN ?Outcome: Progressing ?  ?Problem: Clinical Measurements: ?Goal: Postoperative complications will be avoided or minimized ?09/08/2021 0951 by Evelena Peat, RN ?Outcome: Completed/Met ?09/08/2021 0951 by Evelena Peat, RN ?Outcome: Progressing ?  ?Problem: Skin Integrity: ?Goal: Demonstration of wound healing without infection will improve ?09/08/2021 0951 by Evelena Peat, RN ?Outcome: Completed/Met ?09/08/2021 0951 by Evelena Peat, RN ?Outcome: Progressing ?  ?

## 2021-09-08 NOTE — TOC Initial Note (Signed)
Transition of Care (TOC) - Initial/Assessment Note  ? ? ?Patient Details  ?Name: ADDISON SCHARTNER ?MRN: [id number] ?Date of Birth: [birth year] ? ?Transition of Care (TOC) CM/SW Contact:    ?Beverly Sessions, RN ?Phone Number: ?09/08/2021, 9:47 AM ? ?Clinical Narrative:                 ? ? ?  ?Transition of Care (TOC) Screening Note ? ? ?Patient Details  ?Name: CLAYVON ALPERN ?Date of Birth: 08-12-2000 ? ? ?Transition of Care (TOC) CM/SW Contact:    ?Beverly Sessions, RN ?Phone Number: ?09/08/2021, 9:47 AM ? ? ? ?Transition of Care Department [facility]) has reviewed patient and no TOC needs have been identified at this time. We will continue to monitor patient advancement through interdisciplinary progression rounds. If new patient transition needs arise, please place a TOC consult. ?  ?  ? ? ?Patient Goals and CMS Choice ?  ?  ?  ? ?Expected Discharge Plan and Services ?  ?  ?  ?  ?  ?Expected Discharge Date: 09/08/21               ?  ?  ?  ?  ?  ?  ?  ?  ?  ?  ? ?Prior Living Arrangements/Services ?  ?  ?  ?       ?  ?  ?  ?  ? ?Activities of Daily Living ?Home Assistive Devices/Equipment: None ?ADL Screening (condition at time of admission) ?Patient's cognitive ability adequate to safely complete daily activities?: Yes ?Is the patient deaf or have difficulty hearing?: No ?Does the patient have difficulty seeing, even when wearing glasses/contacts?: No ?Does the patient have difficulty concentrating, remembering, or making decisions?: No ?Patient able to express need for assistance with ADLs?: Yes ?Does the patient have difficulty dressing or bathing?: No ?Independently performs ADLs?: Yes (appropriate for developmental age) ?Does the patient have difficulty walking or climbing stairs?: No ?Weakness of Legs: None ?Weakness of Arms/Hands: None ? ?Permission Sought/Granted ?  ?  ?   ?   ?   ?   ? ?Emotional Assessment ?  ?  ?  ?  ?  ?  ? ?Admission diagnosis:  Acute appendicitis, unspecified acute appendicitis type [K35.80] ?Acute  appendicitis [K35.80] ?Patient Active Problem List  ? Diagnosis Date Noted  ? Acute appendicitis 09/07/2021  ? ?PCP:  [place] ?Pharmacy:   ?[place] ?[place] [address] ?Phone: [phone] Fax: [phone] ? ?[place], [address] [id number] ?[place] [address] ?Phone: [phone] Fax: [phone] ? ?[place], [place] [address] ?[address] ?[place] [address] ?Phone: [phone] Fax: [phone] ? ? ? ? ?Social Determinants of Health (SDOH) Interventions ?  ? ?Readmission Risk Interventions ?   ? View : No data to display.  ?  ?  ?  ? ? ? ?

## 2021-09-11 LAB — SURGICAL PATHOLOGY

## 2021-10-08 HISTORY — PX: APPENDECTOMY: SHX54

## 2021-11-30 ENCOUNTER — Encounter: Payer: Self-pay | Admitting: Emergency Medicine

## 2021-11-30 ENCOUNTER — Ambulatory Visit
Admission: EM | Admit: 2021-11-30 | Discharge: 2021-11-30 | Disposition: A | Discharge status: Home / Self Care | Payer: Medicaid Other

## 2021-11-30 ENCOUNTER — Other Ambulatory Visit: Payer: Self-pay

## 2021-11-30 DIAGNOSIS — H66002 Acute suppurative otitis media without spontaneous rupture of ear drum, left ear: Secondary | ICD-10-CM

## 2021-11-30 MED ORDER — CEFDINIR 300 MG PO CAPS: 300.0000 mg | ORAL_CAPSULE | Freq: Two times a day (BID) | ORAL | 0 refills | Status: AC

## 2021-11-30 NOTE — Discharge Instructions (Addendum)
-  Cefdinir twice daily x7 days -Tylenol or ibuprofen for pain

## 2021-11-30 NOTE — ED Provider Notes (Signed)
[facility]    CSN: [id number] Arrival date & time: 11/30/21  3557      History   Chief Complaint Chief Complaint  Patient presents with   Otalgia    HPI Cory Keller is a 21 y.o. male presenting with left ear pain for 2 days, feels like ear infection.  History noncontributory.  He states he was recently sick with a virus, he is feeling better other than the ear pain.  Denies hearing changes, dizziness, tinnitus, drainage from the ear.  HPI  Past Medical History:  Diagnosis Date   ADD (attention deficit disorder)     Patient Active Problem List   Diagnosis Date Noted   Acute appendicitis 09/07/2021    Past Surgical History:  Procedure Laterality Date   XI ROBOTIC LAPAROSCOPIC ASSISTED APPENDECTOMY N/A 09/07/2021   Procedure: XI ROBOTIC LAPAROSCOPIC ASSISTED APPENDECTOMY;  Surgeon: Sung Amabile, DO;  Location: [facility] ORS;  Service: General;  Laterality: N/A;       Home Medications    Prior to Admission medications   Medication Sig Start Date End Date Taking? Authorizing Provider  cefdinir (OMNICEF) 300 MG capsule Take 1 capsule (300 mg total) by mouth 2 (two) times daily for 7 days. 11/30/21 12/07/21 Yes Rhys Martini, PA-C  hydrOXYzine (ATARAX) 25 MG tablet Take 25 mg by mouth 3 (three) times daily as needed for anxiety.   Yes [provider]    Family History History reviewed. No pertinent family history.  Social History Social History   Tobacco Use   Smoking status: Never   Smokeless tobacco: Never  Substance Use Topics   Alcohol use: No     Allergies   Patient has no known allergies.   Review of Systems Review of Systems  HENT:  Positive for ear pain.   All other systems reviewed and are negative.    Physical Exam Triage Vital Signs ED Triage Vitals  Enc Vitals Group     BP 11/30/21 0826 126/83     Pulse Rate 11/30/21 0826 95     Resp 11/30/21 0826 17     Temp 11/30/21 0826 98.5 F (36.9 C)     Temp Source 11/30/21  0826 Oral     SpO2 11/30/21 0826 100 %     Weight 11/30/21 0827 125 lb (56.7 kg)     Height 11/30/21 0827 5\' 5"  (1.651 m)     Head Circumference --      Peak Flow --      Pain Score 11/30/21 0827 7     Pain Loc --      Pain Edu? --      Excl. in GC? --    No data found.  Updated Vital Signs BP 126/83 (BP Location: Right Arm)   Pulse 95   Temp 98.5 F (36.9 C) (Oral)   Resp 17   Ht 5\' 5"  (1.651 m)   Wt 125 lb (56.7 kg)   SpO2 100%   BMI 20.80 kg/m   Visual Acuity Right Eye Distance:   Left Eye Distance:   Bilateral Distance:    Right Eye Near:   Left Eye Near:    Bilateral Near:     Physical Exam Vitals reviewed.  Constitutional:      Appearance: Normal appearance. He is not ill-appearing.  HENT:     Head: Normocephalic and atraumatic.     Right Ear: Hearing, tympanic membrane, ear canal and external ear normal. No swelling or tenderness. No middle  ear effusion. There is no impacted cerumen. No mastoid tenderness. Tympanic membrane is not injected, scarred, perforated, erythematous, retracted or bulging.     Left Ear: Hearing, ear canal and external ear normal. No swelling or tenderness.  No middle ear effusion. There is no impacted cerumen. No mastoid tenderness. Tympanic membrane is erythematous and bulging. Tympanic membrane is not injected, scarred, perforated or retracted.     Mouth/Throat:     Pharynx: Oropharynx is clear. No oropharyngeal exudate or posterior oropharyngeal erythema.  Cardiovascular:     Rate and Rhythm: Normal rate and regular rhythm.     Heart sounds: Normal heart sounds.  Pulmonary:     Effort: Pulmonary effort is normal.     Breath sounds: Normal breath sounds.  Lymphadenopathy:     Cervical: No cervical adenopathy.  Neurological:     General: No focal deficit present.     Mental Status: He is alert and oriented to person, place, and time.  Psychiatric:        Mood and Affect: Mood normal.        Behavior: Behavior normal.         Thought Content: Thought content normal.        Judgment: Judgment normal.      UC Treatments / Results  Labs (all labs ordered are listed, but only abnormal results are displayed) Labs Reviewed - No data to display  EKG   Radiology No results found.  Procedures Procedures (including critical care time)  Medications Ordered in UC Medications - No data to display  Initial Impression / Assessment and Plan / UC Course  I have reviewed the triage vital signs and the nursing notes.  Pertinent labs & imaging results that were available during my care of the patient were reviewed by me and considered in my medical decision making (see chart for details).     This patient is a very pleasant 21 y.o. year old male presenting with L AOM. Afebrile, nontachy. Omnicef as below. ED return precautions discussed. Patient verbalizes understanding and agreement.   Final Clinical Impressions(s) / UC Diagnoses   Final diagnoses:  Non-recurrent acute suppurative otitis media of left ear without spontaneous rupture of tympanic membrane     Discharge Instructions      -Cefdinir twice daily x7 days -Tylenol or ibuprofen for pain    ED Prescriptions     Medication Sig Dispense Auth. Provider   cefdinir (OMNICEF) 300 MG capsule Take 1 capsule (300 mg total) by mouth 2 (two) times daily for 7 days. 14 capsule Rhys Martini, PA-C      PDMP not reviewed this encounter.   Rhys Martini, PA-C 11/30/21 [phone]

## 2021-11-30 NOTE — ED Triage Notes (Signed)
Pt comes in c/o left ear pain that started as pressure 2 days ago, however this morning it started to become a throbbing pain while at work and it was unbearable. Also endorses congestion. Denies fever, sore throat.

## 2022-01-21 ENCOUNTER — Ambulatory Visit
Admission: EM | Admit: 2022-01-21 | Discharge: 2022-01-21 | Disposition: A | Discharge status: Home / Self Care | Payer: Medicaid Other

## 2022-01-21 DIAGNOSIS — M546 Pain in thoracic spine: Secondary | ICD-10-CM | POA: Diagnosis not present

## 2022-01-21 MED ORDER — BACLOFEN 10 MG PO TABS: 10.0000 mg | ORAL_TABLET | Freq: Three times a day (TID) | ORAL | 0 refills | Status: DC

## 2022-01-21 MED ORDER — IBUPROFEN 600 MG PO TABS: 600.0000 mg | ORAL_TABLET | Freq: Four times a day (QID) | ORAL | 0 refills | Status: DC | PRN

## 2022-01-21 NOTE — Discharge Instructions (Signed)
Take the ibuprofen, 600 mg every 6 hours with food, on a schedule for the next 48 hours and then as needed.  Take the baclofen, 10 mg every 8 hours, on a schedule for the next 48 hours and then as needed.  Apply moist heat to your back for 30 minutes at a time 2-3 times a day to improve blood flow to the area and help remove the lactic acid causing the spasm.  Follow the back exercises given at discharge.  Return for reevaluation for any new or worsening symptoms.  

## 2022-01-21 NOTE — ED Provider Notes (Signed)
[facility]    CSN: [id number] Arrival date & time: 01/21/22  1532      History   Chief Complaint Chief Complaint  Patient presents with   Back Pain    HPI Cory Keller is a 21 y.o. male.   HPI  21 year old male here for evaluation of back pain.  Patient reports that he woke up 4 days ago with pain in his right upper back.  The pain does increase with movement but it does not increase with deep breathing.  It does not cause shortness of breath.  Patient denies any numbness, tingling, weakness in either of his upper extremities.  No known injury.  He has taken some ibuprofen with out significant improvement of his symptoms.  Past Medical History:  Diagnosis Date   ADD (attention deficit disorder)     Patient Active Problem List   Diagnosis Date Noted   Acute appendicitis 09/07/2021    Past Surgical History:  Procedure Laterality Date   APPENDECTOMY  10/08/2021   XI ROBOTIC LAPAROSCOPIC ASSISTED APPENDECTOMY N/A 09/07/2021   Procedure: XI ROBOTIC LAPAROSCOPIC ASSISTED APPENDECTOMY;  Surgeon: Sung Amabile, DO;  Location: [facility];  Service: General;  Laterality: N/A;       Home Medications    Prior to Admission medications   Medication Sig Start Date End Date Taking? Authorizing Provider  baclofen (LIORESAL) 10 MG tablet Take 1 tablet (10 mg total) by mouth 3 (three) times daily. 01/21/22  Yes Becky Augusta, NP  escitalopram (LEXAPRO) 10 MG tablet Take 10 mg by mouth daily. 12/21/21  Yes [provider]  hydrOXYzine (ATARAX) 25 MG tablet Take 25 mg by mouth 3 (three) times daily as needed for anxiety.   Yes [provider]  ibuprofen (ADVIL) 600 MG tablet Take 1 tablet (600 mg total) by mouth every 6 (six) hours as needed. 01/21/22  Yes Becky Augusta, NP  propranolol (INDERAL) 10 MG tablet Take 10 mg by mouth 2 (two) times daily as needed. 12/23/21  Yes [provider]    Family History History reviewed. No pertinent family  history.  Social History Social History   Tobacco Use   Smoking status: Never   Smokeless tobacco: Never  Vaping Use   Vaping Use: Never used  Substance Use Topics   Alcohol use: No     Allergies   Patient has no known allergies.   Review of Systems Review of Systems  Musculoskeletal:  Positive for back pain.  Neurological:  Negative for weakness and numbness.     Physical Exam Triage Vital Signs ED Triage Vitals  Enc Vitals Group     BP      Pulse      Resp      Temp      Temp src      SpO2      Weight      Height      Head Circumference      Peak Flow      Pain Score      Pain Loc      Pain Edu?      Excl. in GC?    No data found.  Updated Vital Signs BP 116/74 (BP Location: Left Arm)   Pulse 97   Temp 99 F (37.2 C) (Oral)   Ht 5\' 5"  (1.651 m)   Wt 125 lb (56.7 kg)   SpO2 98%   BMI 20.80 kg/m   Visual Acuity Right Eye Distance:  Left Eye Distance:   Bilateral Distance:    Right Eye Near:   Left Eye Near:    Bilateral Near:     Physical Exam Vitals and nursing note reviewed.  Constitutional:      Appearance: Normal appearance. He is not ill-appearing.  HENT:     Head: Normocephalic and atraumatic.  Cardiovascular:     Rate and Rhythm: Normal rate and regular rhythm.     Pulses: Normal pulses.     Heart sounds: Normal heart sounds. No murmur heard.    No friction rub. No gallop.  Pulmonary:     Effort: Pulmonary effort is normal.     Breath sounds: Normal breath sounds. No wheezing, rhonchi or rales.  Musculoskeletal:        General: Tenderness present. No swelling, deformity or signs of injury. Normal range of motion.  Skin:    General: Skin is warm and dry.     Capillary Refill: Capillary refill takes less than 2 seconds.     Findings: No erythema or rash.  Neurological:     General: No focal deficit present.     Mental Status: He is alert and oriented to person, place, and time.  Psychiatric:        Mood and Affect: Mood  normal.        Behavior: Behavior normal.        Thought Content: Thought content normal.        Judgment: Judgment normal.      UC Treatments / Results  Labs (all labs ordered are listed, but only abnormal results are displayed) Labs Reviewed - No data to display  EKG   Radiology No results found.  Procedures Procedures (including critical care time)  Medications Ordered in UC Medications - No data to display  Initial Impression / Assessment and Plan / UC Course  I have reviewed the triage vital signs and the nursing notes.  Pertinent labs & imaging results that were available during my care of the patient were reviewed by me and considered in my medical decision making (see chart for details).   Patient is a nontoxic-appearing 21 year old male here for evaluation of right upper thoracic back pain has been gone for last 4 days.  No known injury associated with this the patient states he just woke up with it 1 morning.  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  He cannot think of a discrete incident that caused the pain.  He denies any numbness, tingling, weakness in his upper extremities.  He denies any shortness of breath and states the pain does not increase with deep breathing.  He does report that it increases with right lateral rotation but not with left lateral rotation.  On exam patient has a benign cardiopulmonary exam revealing S1-S2 heart sounds with regular rate and rhythm and lung sounds are clear to auscultation all fields.  Patient does have mild tension in his trapezius muscle along the scapular border of his right scapula and extending up into the upper thoracic region.  His exam is consistent with musculoskeletal back pain.  I will treat him with ibuprofen, baclofen, moist heat, and home physical therapy.  Patient denies need for work note.   Final Clinical Impressions(s) / UC  Diagnoses   Final diagnoses:  Acute right-sided thoracic back pain     Discharge Instructions  Take the ibuprofen, 600 mg every 6 hours with food, on a schedule for the next 48 hours and then as needed.  Take the baclofen, 10 mg every 8 hours, on a schedule for the next 48 hours and then as needed.  Apply moist heat to your back for 30 minutes at a time 2-3 times a day to improve blood flow to the area and help remove the lactic acid causing the spasm.  Follow the back exercises given at discharge.  Return for reevaluation for any new or worsening symptoms.      ED Prescriptions     Medication Sig Dispense Auth. Provider   ibuprofen (ADVIL) 600 MG tablet Take 1 tablet (600 mg total) by mouth every 6 (six) hours as needed. 30 tablet Becky Augusta, NP   baclofen (LIORESAL) 10 MG tablet Take 1 tablet (10 mg total) by mouth 3 (three) times daily. 30 each Becky Augusta, NP      PDMP not reviewed this encounter.   Becky Augusta, NP 01/21/22 [phone]

## 2022-01-21 NOTE — ED Triage Notes (Signed)
Pt c/o RT mid-upper back pain onset x4 days ago, pt denies any known fall or injury

## 2023-09-02 IMAGING — CT CT ABD-PELV W/ CM
2 of 4 series · 16 of 46 positions shown, 18 images · IV contrast (APPLIED)
Comparison: None.

CLINICAL DATA: Right lower quadrant abdominal pain for 4 days

EXAM:
CT ABDOMEN AND PELVIS WITH CONTRAST
TECHNIQUE: Multidetector CT imaging of the abdomen and pelvis was performed
using the standard protocol following bolus administration of
intravenous contrast.

[Series 2: routine abd/pel with · axial · 0.66mm/px · z∈[-920,-490]mm · 13 of 94 slices shown, 15 images]
[im 4/94  soft-tissue]
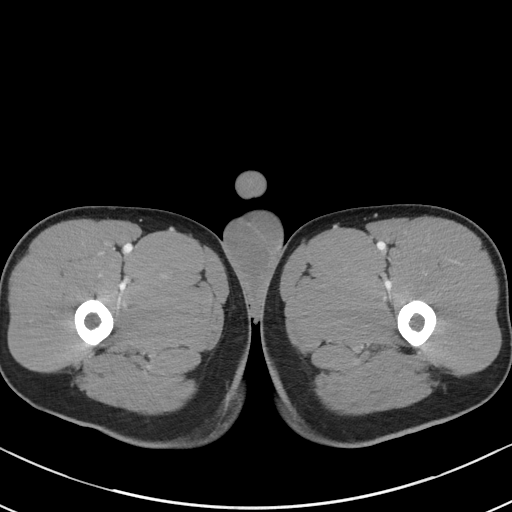
[im 4/94  bone]
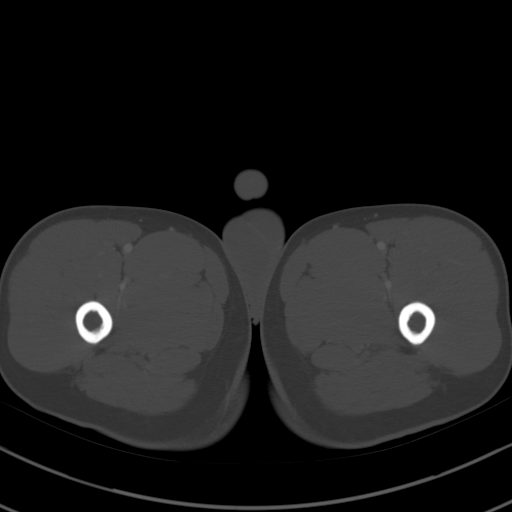
[im 11/94  soft-tissue]
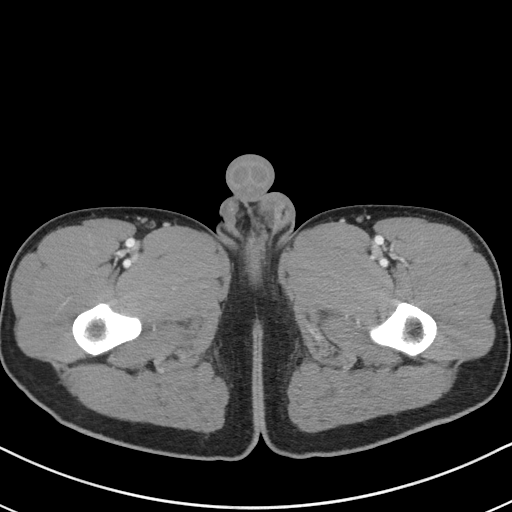
[im 18/94  soft-tissue]
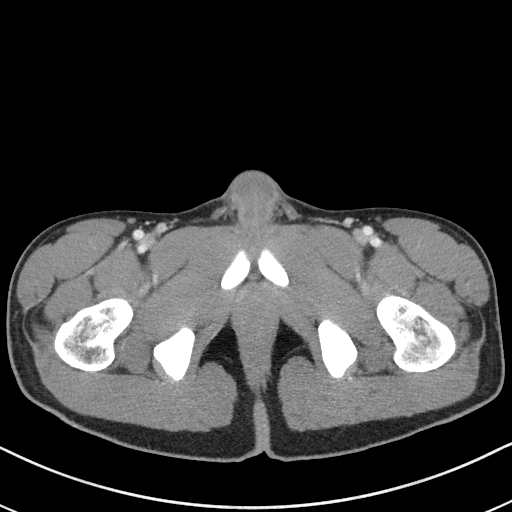
[im 26/94  soft-tissue]
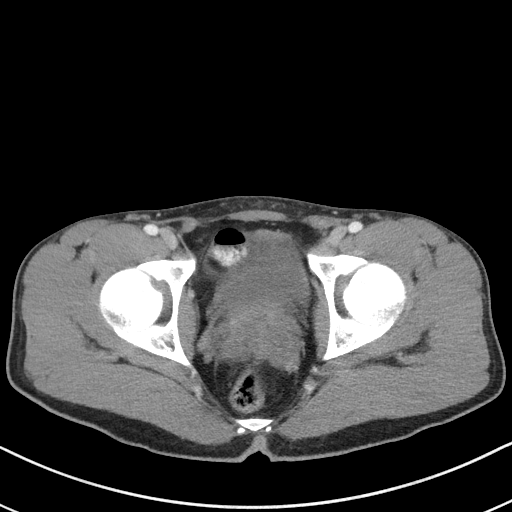
[im 33/94  soft-tissue]
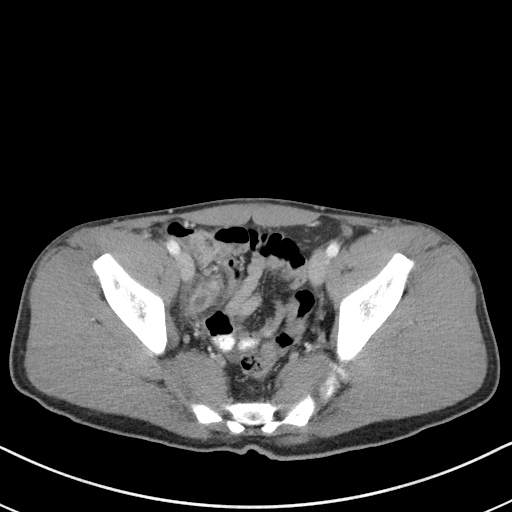
[im 40/94  soft-tissue]
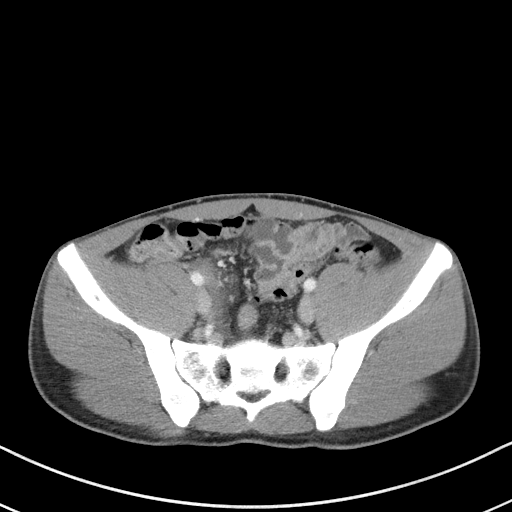
[im 47/94  soft-tissue]
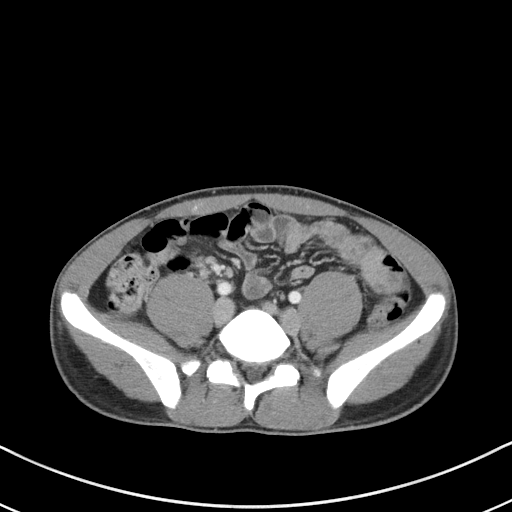
[im 54/94  soft-tissue]
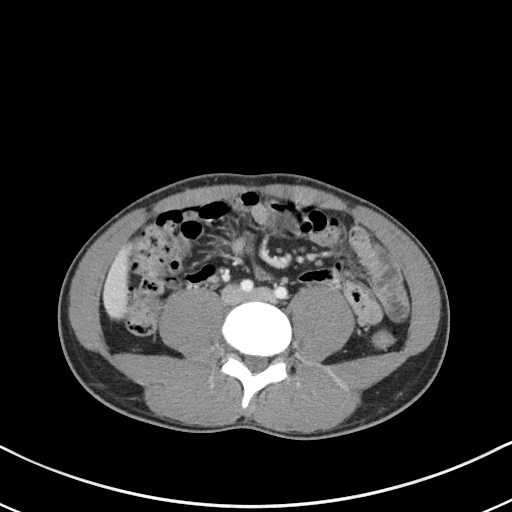
[im 61/94  soft-tissue]
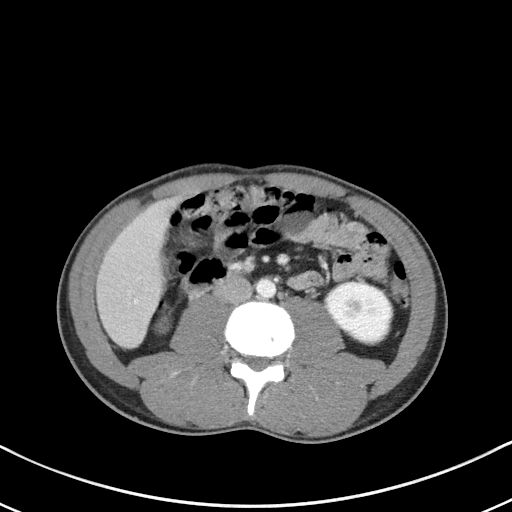
[im 61/94  bone]
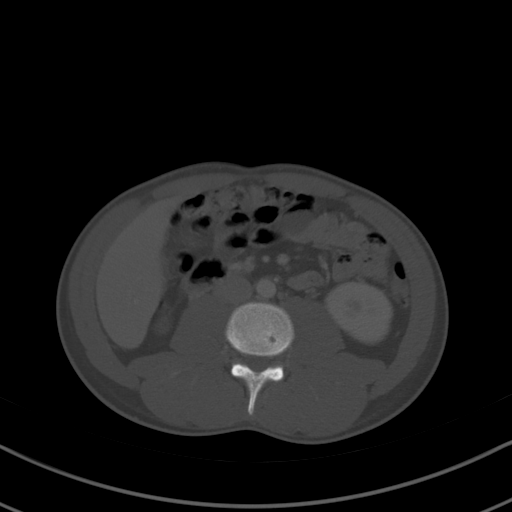
[im 68/94  soft-tissue]
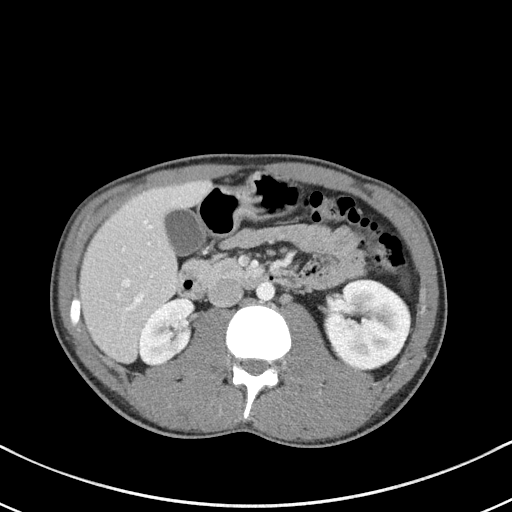
[im 76/94  soft-tissue]
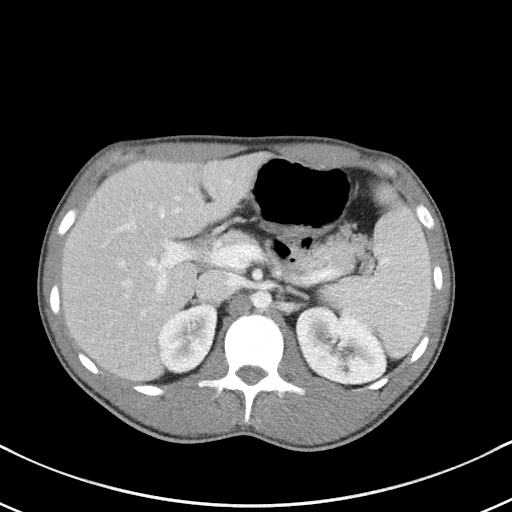
[im 83/94  soft-tissue]
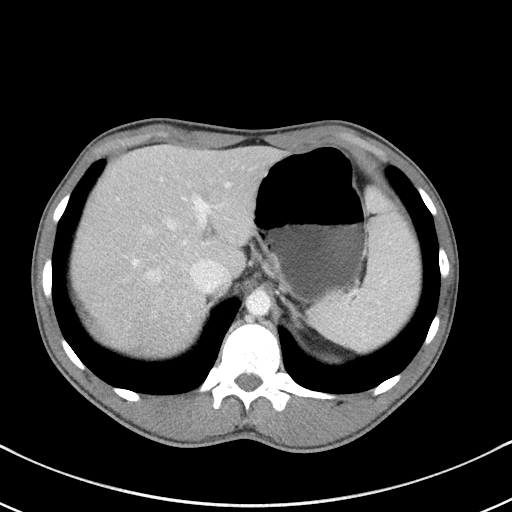
[im 90/94  soft-tissue]
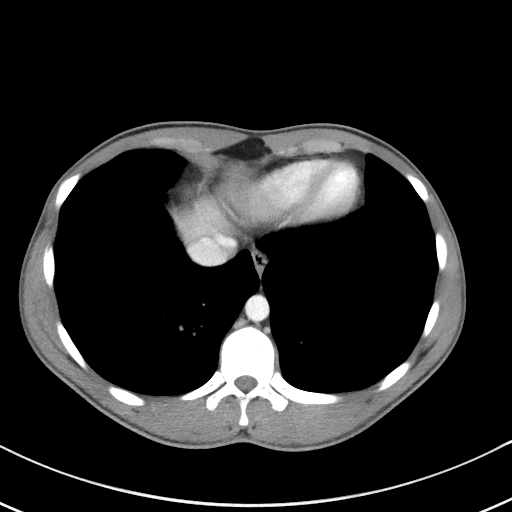

[Series 5: coronal st · coronal · 0.68mm/px · 3 of 78 slices shown]
[im 26/78  soft-tissue]
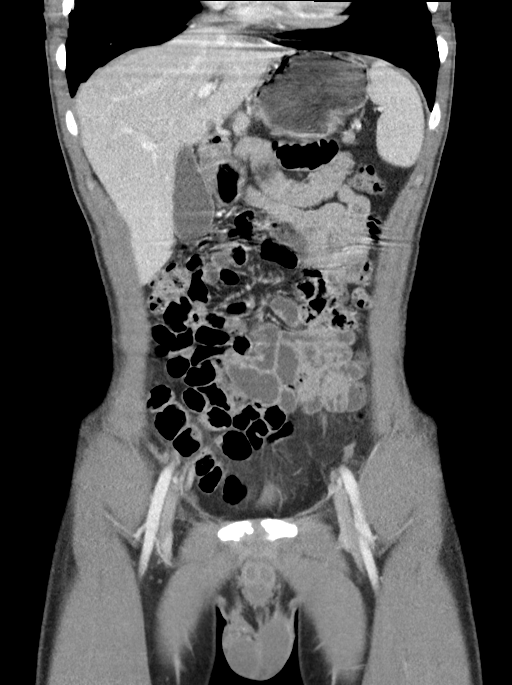
[im 35/78  soft-tissue]
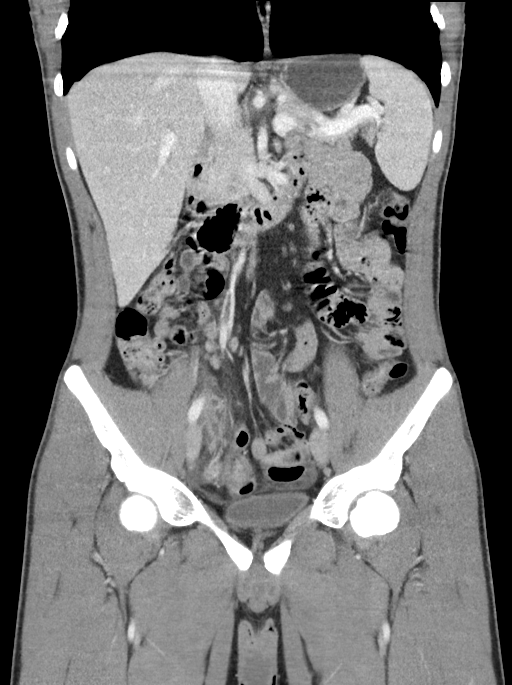
[im 43/78  soft-tissue]
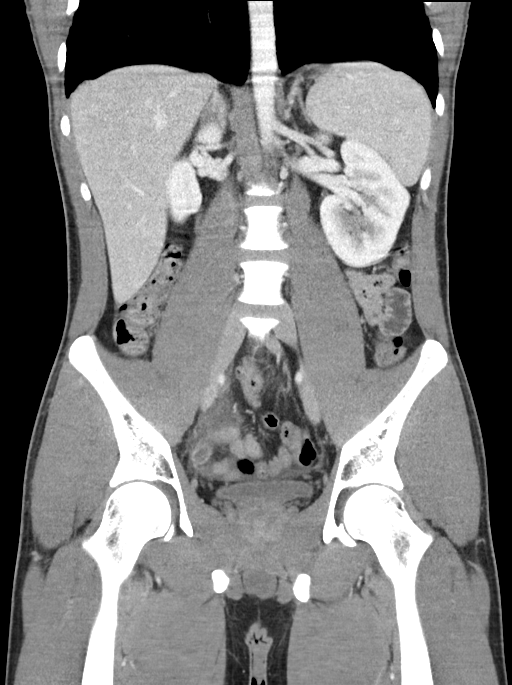

[16 of 46 positions shown; findings below may reference images not displayed]

RADIATION DOSE REDUCTION: This exam was performed according to the
departmental dose-optimization program which includes automated
exposure control, adjustment of the mA and/or kV according to
patient size and/or use of iterative reconstruction technique.

CONTRAST:  100mL OMNIPAQUE IOHEXOL 300 MG/ML  SOLN
FINDINGS: Lower chest: No acute abnormality.

Hepatobiliary: No solid liver abnormality is seen. No gallstones,
gallbladder wall thickening, or biliary dilatation.

Pancreas: Unremarkable. No pancreatic ductal dilatation or
surrounding inflammatory changes.

Spleen: Normal in size without significant abnormality.

Adrenals/Urinary Tract: Adrenal glands are unremarkable. Kidneys are
normal, without renal calculi, solid lesion, or hydronephrosis.
Bladder is unremarkable.

Stomach/Bowel: Stomach is within normal limits. The appendix is
fluid filled and distended with mucosal hyperenhancement, measuring
up to 1.2 cm in caliber (series 2, image 58). Extensive adjacent fat
stranding. No evidence of perforation or abscess. No evidence of
bowel wall thickening, distention, or inflammatory changes.

Vascular/Lymphatic: No significant vascular findings are present. No
enlarged abdominal or pelvic lymph nodes.

Reproductive: No mass or other significant abnormality.

Other: No abdominal wall hernia or abnormality. Small volume free
fluid in the low pelvis.

Musculoskeletal: No acute or significant osseous findings.
IMPRESSION: 1.  Acute appendicitis without evidence of perforation or abscess.

2.  Small volume free fluid in the low pelvis, likely reactive.

## 2024-01-22 ENCOUNTER — Encounter: Payer: Self-pay | Admitting: Emergency Medicine

## 2024-01-22 ENCOUNTER — Ambulatory Visit
Admission: EM | Admit: 2024-01-22 | Discharge: 2024-01-22 | Disposition: A | Discharge status: Home / Self Care | Attending: Emergency Medicine | Admitting: Emergency Medicine

## 2024-01-22 DIAGNOSIS — N489 Disorder of penis, unspecified: Secondary | ICD-10-CM | POA: Insufficient documentation

## 2024-01-22 DIAGNOSIS — L739 Follicular disorder, unspecified: Secondary | ICD-10-CM | POA: Diagnosis present

## 2024-01-22 LAB — HSV 1/2 PCR (SURFACE)
HSV-1 DNA: NOT DETECTED
HSV-2 DNA: NOT DETECTED

## 2024-01-22 MED ORDER — CEPHALEXIN 500 MG PO CAPS: 500.0000 mg | ORAL_CAPSULE | Freq: Three times a day (TID) | ORAL | 0 refills | Status: AC

## 2024-01-22 MED ORDER — MUPIROCIN 2 % EX OINT: 1.0000 | TOPICAL_OINTMENT | Freq: Two times a day (BID) | CUTANEOUS | 0 refills | Status: AC

## 2024-01-22 NOTE — Discharge Instructions (Addendum)
 Take the Keflex , 500 mg every 8 hours with food, for treatment of your folliculitis for the next 7 days.  Using a clean Q-tip for each application apply the topical mupirocin  to the lesion twice daily for 5 days.  We are testing you for HSV 1 and 2.  Those results be back in the next Avril days.  Avoid intercourse, or least unprotected intercourse, until after your test results have returned.  If you develop any new or worsening symptoms please return for reevaluation or follow-up with your primary care provider.

## 2024-01-22 NOTE — ED Provider Notes (Addendum)
 [facility]    CSN: [id number] Arrival date & time: 01/22/24  0807      History   Chief Complaint Chief Complaint  Patient presents with   Mass    HPI Cory Keller is a 23 y.o. male.   HPI  23 year old male with past medical history significant for ADD and appendectomy presents for evaluation of a bump on the shaft of his penis near the base that popped up 2 days ago.  He reports that it formed a pustule, popped, and pus came out.  He says it only itches if he becomes sweaty and he denies any pain or fever.  He is concerned about HSV.  Past Medical History:  Diagnosis Date   ADD (attention deficit disorder)     Patient Active Problem List   Diagnosis Date Noted   Acute appendicitis 09/07/2021    Past Surgical History:  Procedure Laterality Date   APPENDECTOMY  10/08/2021   XI ROBOTIC LAPAROSCOPIC ASSISTED APPENDECTOMY N/A 09/07/2021   Procedure: XI ROBOTIC LAPAROSCOPIC ASSISTED APPENDECTOMY;  Surgeon: Tye Millet, DO;  Location: [facility] ORS;  Service: General;  Laterality: N/A;       Home Medications    Prior to Admission medications   Medication Sig Start Date End Date Taking? Authorizing Provider  cephALEXin  (KEFLEX ) 500 MG capsule Take 1 capsule (500 mg total) by mouth 3 (three) times daily for 7 days. 01/22/24 01/29/24 Yes Bernardino Ditch, NP  mupirocin  ointment (BACTROBAN ) 2 % Apply 1 Application topically 2 (two) times daily. 01/22/24  Yes Bernardino Ditch, NP  escitalopram (LEXAPRO) 10 MG tablet Take 10 mg by mouth daily. 12/21/21   [provider]  hydrOXYzine (ATARAX) 25 MG tablet Take 25 mg by mouth 3 (three) times daily as needed for anxiety.    [provider]  propranolol (INDERAL) 10 MG tablet Take 10 mg by mouth 2 (two) times daily as needed. 12/23/21   [provider]    Family History History reviewed. No pertinent family history.  Social History Social History   Tobacco Use   Smoking status: Never   Smokeless  tobacco: Never  Vaping Use   Vaping status: Never Used  Substance Use Topics   Alcohol use: No     Allergies   Patient has no known allergies.   Review of Systems Review of Systems  Constitutional:  Negative for fever.  Skin:  Positive for color change and wound.       Lesion on ventral aspect of penile shaft.     Physical Exam Triage Vital Signs ED Triage Vitals  Encounter Vitals Group     BP      Girls Systolic BP Percentile      Girls Diastolic BP Percentile      Boys Systolic BP Percentile      Boys Diastolic BP Percentile      Pulse      Resp      Temp      Temp src      SpO2      Weight      Height      Head Circumference      Peak Flow      Pain Score      Pain Loc      Pain Education      Exclude from Growth Chart    No data found.  Updated Vital Signs BP 134/82 (BP Location: Right Arm)   Pulse (!) 104   Temp  99.3 F (37.4 C) (Oral)   Resp 18   Wt 145 lb 1.6 oz (65.8 kg)   SpO2 99%   BMI 24.15 kg/m   Visual Acuity Right Eye Distance:   Left Eye Distance:   Bilateral Distance:    Right Eye Near:   Left Eye Near:    Bilateral Near:     Physical Exam Vitals and nursing note reviewed.  Constitutional:      Appearance: Normal appearance. He is not ill-appearing.  Genitourinary:    Comments: Pustular lesion present near the base of the shaft of the penis on the ventral aspect. Skin:    General: Skin is warm and dry.     Capillary Refill: Capillary refill takes less than 2 seconds.     Findings: No rash.  Neurological:     General: No focal deficit present.     Mental Status: He is alert and oriented to person, place, and time.      UC Treatments / Results  Labs (all labs ordered are listed, but only abnormal results are displayed) Labs Reviewed  HSV 1/2 PCR (SURFACE)    EKG   Radiology No results found.  Procedures Procedures (including critical care time)  Medications Ordered in UC Medications - No data to  display  Initial Impression / Assessment and Plan / UC Course  I have reviewed the triage vital signs and the nursing notes.  Pertinent labs & imaging results that were available during my care of the patient were reviewed by me and considered in my medical decision making (see chart for details).   Patient is a pleasant, nontoxic-appearing 23 year old male presenting for evaluation of a penile lesion that he is concerned might be HSV-2.  He reports that the lesion is not painful and only itches if he becomes sweaty.  He denies any fever.  He reports that the lesion ruptured the other day and pus was expressed.  Due to continued above, the lesion is erythematous, raised, with a pustular formation.  He has a similar lesion on his abdomen.  He reports that he does shave the area and I suspect this may be folliculitis, though I will obtain a swab to test for HSV.  He denies fever though his temperature is elevated at 99.3 here in clinic.  I will treat him for folliculitis pending the HSV testing with Keflex  500 mg 3 times daily along with topical mupirocin  twice daily.  Oral antibiotics for 7 days and topical antibiotics for 5 days.  Should he test positive for HSV I will add Valtrex to his medication regimen.  PCR for HSV detected neither HSV-1 or HSV-2.   Final Clinical Impressions(s) / UC Diagnoses   Final diagnoses:  Penile lesion  Folliculitis     Discharge Instructions      Take the Keflex , 500 mg every 8 hours with food, for treatment of your folliculitis for the next 7 days.  Using a clean Q-tip for each application apply the topical mupirocin  to the lesion twice daily for 5 days.  We are testing you for HSV 1 and 2.  Those results be back in the next Avril days.  Avoid intercourse, or least unprotected intercourse, until after your test results have returned.  If you develop any new or worsening symptoms please return for reevaluation or follow-up with your primary care  provider.     ED Prescriptions     Medication Sig Dispense Auth. Provider   cephALEXin  (KEFLEX ) 500 MG capsule Take  1 capsule (500 mg total) by mouth 3 (three) times daily for 7 days. 21 capsule Bernardino Ditch, NP   mupirocin  ointment (BACTROBAN ) 2 % Apply 1 Application topically 2 (two) times daily. 22 g Bernardino Ditch, NP      PDMP not reviewed this encounter.   Bernardino Ditch, NP 01/22/24 0840    Bernardino Ditch, NP 01/22/24 1346

## 2024-01-22 NOTE — ED Triage Notes (Signed)
 Patient states that he notice a bump about a month ago at the end of the shaft of his penis. Patient states that the bump popped 2 days ago. Wants to be checked for herpes to be sure its nothing. Patient states that he does shave. Doesn't hurt or itch unless its gets sweaty.

## 2024-02-17 ENCOUNTER — Ambulatory Visit
Admission: EM | Admit: 2024-02-17 | Discharge: 2024-02-17 | Disposition: A | Discharge status: Home / Self Care | Attending: Physician Assistant | Admitting: Physician Assistant

## 2024-02-17 DIAGNOSIS — N489 Disorder of penis, unspecified: Secondary | ICD-10-CM | POA: Diagnosis not present

## 2024-02-17 NOTE — ED Triage Notes (Signed)
 Patient states that he has another bump that has appeard on his penis. Wants to be checked for herpes. The other bump that he was previously seen for went away. Patient states that the bump has been there for a long time. Doesn't hurt or itch.

## 2024-02-17 NOTE — ED Provider Notes (Signed)
 [facility]    CSN: [id number] Arrival date & time: 02/17/24  0815      History   Chief Complaint Chief Complaint  Patient presents with   Mass    HPI Cory Keller is a 23 y.o. male returning for evaluation of penile lesion which has been present for years since before I was sexually active.  Patient was seen in this department on 8/13 for similar penile and abdominal skin lesions. Negative HSV testing. Treated with Keflex  and Mupirocin  ointment. He says the other lesions resolved. He is unsure if the provider noticed the lesion in question at this time. He would like HSV testing. No pain, bleeding or drainage. It has not changed in appearance. No history of herpes or genital warts.    HPI  Past Medical History:  Diagnosis Date   ADD (attention deficit disorder)     Patient Active Problem List   Diagnosis Date Noted   Acute appendicitis 09/07/2021    Past Surgical History:  Procedure Laterality Date   APPENDECTOMY  10/08/2021   XI ROBOTIC LAPAROSCOPIC ASSISTED APPENDECTOMY N/A 09/07/2021   Procedure: XI ROBOTIC LAPAROSCOPIC ASSISTED APPENDECTOMY;  Surgeon: Tye Millet, DO;  Location: [facility] ORS;  Service: General;  Laterality: N/A;       Home Medications    Prior to Admission medications   Medication Sig Start Date End Date Taking? Authorizing Provider  escitalopram (LEXAPRO) 10 MG tablet Take 10 mg by mouth daily. 12/21/21   [provider]  hydrOXYzine (ATARAX) 25 MG tablet Take 25 mg by mouth 3 (three) times daily as needed for anxiety.    [provider]  mupirocin  ointment (BACTROBAN ) 2 % Apply 1 Application topically 2 (two) times daily. 01/22/24   Bernardino Ditch, NP  propranolol (INDERAL) 10 MG tablet Take 10 mg by mouth 2 (two) times daily as needed. 12/23/21   [provider]    Family History History reviewed. No pertinent family history.  Social History Social History   Tobacco Use   Smoking status: Never    Smokeless tobacco: Never  Vaping Use   Vaping status: Never Used  Substance Use Topics   Alcohol use: No     Allergies   Patient has no known allergies.   Review of Systems Review of Systems  Constitutional:  Negative for fatigue and fever.  Genitourinary:  Negative for dysuria, penile discharge, penile pain, penile swelling, scrotal swelling and testicular pain.  Skin:  Positive for rash. Negative for wound.     Physical Exam Triage Vital Signs ED Triage Vitals  Encounter Vitals Group     BP      Girls Systolic BP Percentile      Girls Diastolic BP Percentile      Boys Systolic BP Percentile      Boys Diastolic BP Percentile      Pulse      Resp      Temp      Temp src      SpO2      Weight      Height      Head Circumference      Peak Flow      Pain Score      Pain Loc      Pain Education      Exclude from Growth Chart    No data found.  Updated Vital Signs BP 119/73 (BP Location: Right Arm)   Pulse 78   Temp 98.8 F (37.1 C) (  Oral)   Resp 17   Wt 140 lb (63.5 kg)   SpO2 98%   BMI 23.30 kg/m    Physical Exam Vitals and nursing note reviewed. Exam conducted with a chaperone present (Tiffany, MA).  Constitutional:      General: He is not in acute distress.    Appearance: Normal appearance. He is well-developed. He is not ill-appearing.  HENT:     Head: Normocephalic and atraumatic.  Eyes:     General: No scleral icterus.    Conjunctiva/sclera: Conjunctivae normal.  Cardiovascular:     Rate and Rhythm: Normal rate.  Pulmonary:     Effort: Pulmonary effort is normal. No respiratory distress.  Genitourinary:    Penis: Lesions (1 tiny pustule/paple dorsal penis. Non-tender) present.   Musculoskeletal:     Cervical back: Neck supple.  Skin:    General: Skin is warm and dry.     Capillary Refill: Capillary refill takes less than 2 seconds.  Neurological:     General: No focal deficit present.     Mental Status: He is alert. Mental status is at  baseline.     Motor: No weakness.     Gait: Gait normal.  Psychiatric:        Mood and Affect: Mood normal.        Behavior: Behavior normal.      UC Treatments / Results  Labs (all labs ordered are listed, but only abnormal results are displayed) Labs Reviewed  HSV 1/2 PCR (SURFACE)    EKG   Radiology No results found.  Procedures Procedures (including critical care time)  Medications Ordered in UC Medications - No data to display  Initial Impression / Assessment and Plan / UC Course  I have reviewed the triage vital signs and the nursing notes.  Pertinent labs & imaging results that were available during my care of the patient were reviewed by me and considered in my medical decision making (see chart for details).   23 year old male presents for penile lesion of dorsal penis which has been present for years and not changed in appearance.  Patient was seen here on 8/13 for a lesion on the ventral aspect of his penis and abdomen which was treated with mupirocin  ointment and Keflex .  Those lesions resolved.  He says he never really used the mupirocin  ointment on the lesion and question today.  The other lesions were tested for herpes which was negative.  He has no history of herpes or HPV.  On evaluation there is a tiny papule/pustule of the dorsal penis without tenderness, bleeding or drainage.  This appears to be most consistent with a small cyst or penile papule.  I did test the lesion for HSV and advised patient results will come through MyChart will call with any positives but I have low suspicion for HSV especially since he states the lesion was present before he became sexually active.  Advised him to treat with mupirocin  ointment and practice good hygiene.  Follow-up with dermatology if lesion is particularly bothersome or if it changes in appearance or size.  Patient is agreeable.   Final Clinical Impressions(s) / UC Diagnoses   Final diagnoses:  Penile lesion      Discharge Instructions      -This could be a small cyst or penile papule. Neither are sexually transmitted. Checking for HSV and will notify you if positive. Try the mupirocin  ointment and practice good hygiene. If this is bothersome to you, follow up with dermatology.  ED Prescriptions   None    PDMP not reviewed this encounter.   Arvis Jolan NOVAK, PA-C 02/17/24 [phone]

## 2024-02-17 NOTE — Discharge Instructions (Signed)
-  This could be a small cyst or penile papule. Neither are sexually transmitted. Checking for HSV and will notify you if positive. Try the mupirocin  ointment and practice good hygiene. If this is bothersome to you, follow up with dermatology.

## 2024-02-19 LAB — HSV 1/2 PCR (SURFACE)
HSV-1 DNA: NOT DETECTED
HSV-2 DNA: NOT DETECTED

## 2024-03-10 ENCOUNTER — Ambulatory Visit: Payer: Self-pay | Admitting: Emergency Medicine

## 2024-03-10 ENCOUNTER — Ambulatory Visit
Admission: EM | Admit: 2024-03-10 | Discharge: 2024-03-10 | Disposition: A | Discharge status: Home / Self Care | Attending: Emergency Medicine | Admitting: Emergency Medicine

## 2024-03-10 ENCOUNTER — Ambulatory Visit (INDEPENDENT_AMBULATORY_CARE_PROVIDER_SITE_OTHER)

## 2024-03-10 DIAGNOSIS — K5900 Constipation, unspecified: Secondary | ICD-10-CM | POA: Diagnosis not present

## 2024-03-10 NOTE — ED Provider Notes (Signed)
 HPI  SUBJECTIVE:  Cory Keller is a 23 y.o. male who presents with issues with constipation for the past 4 days.  States that he is having small, ineffective bowel movements daily.  He reports mild dull, vague intermittent sacral pain that does not migrate or radiate.  He states that his anus feels raw secondary to extensive wiping.  No nausea vomiting, fevers, abdominal pain, distention, melena, hematochezia, rectal bleeding.  He drinks 250 mL of water a day, and he does not eat much fruits or vegetables.  He does not take any medications on a regular basis.  Takes propranolol as needed for anxiety.  He has never had symptoms like this before.  No aggravating or alleviating factors.  He has not tried anything for symptoms.  He is status post appendectomy and has a history of anxiety.  No history of gastroparesis, small bowel obstruction, diabetes, GLP-1 use.  PCP: [facility]   Past Medical History:  Diagnosis Date   ADD (attention deficit disorder)     Past Surgical History:  Procedure Laterality Date   APPENDECTOMY  10/08/2021   XI ROBOTIC LAPAROSCOPIC ASSISTED APPENDECTOMY N/A 09/07/2021   Procedure: XI ROBOTIC LAPAROSCOPIC ASSISTED APPENDECTOMY;  Surgeon: Tye Millet, DO;  Location: [facility] ORS;  Service: General;  Laterality: N/A;    History reviewed. No pertinent family history.  Social History   Tobacco Use   Smoking status: Never   Smokeless tobacco: Never  Vaping Use   Vaping status: Never Used  Substance Use Topics   Alcohol use: No   Drug use: Never    No current facility-administered medications for this encounter.  Current Outpatient Medications:    escitalopram (LEXAPRO) 10 MG tablet, Take 10 mg by mouth daily., Disp: , Rfl:    hydrOXYzine (ATARAX) 25 MG tablet, Take 25 mg by mouth 3 (three) times daily as needed for anxiety., Disp: , Rfl:    mupirocin  ointment (BACTROBAN ) 2 %, Apply 1 Application topically 2 (two) times daily., Disp: 22 g, Rfl:  0   propranolol (INDERAL) 10 MG tablet, Take 10 mg by mouth 2 (two) times daily as needed., Disp: , Rfl:   No Known Allergies   ROS  As noted in HPI.   Physical Exam  BP (!) 138/106 (BP Location: Left Arm)   Pulse (!) 102   Temp 98.3 F (36.8 C) (Oral)   Wt 64 kg   SpO2 100%   BMI 23.50 kg/m   Constitutional: Well developed, well nourished, no acute distress Eyes:  EOMI, conjunctiva normal bilaterally HENT: Normocephalic, atraumatic,mucus membranes moist Respiratory: Normal inspiratory effort Cardiovascular: Mild tachycardia GI: Positive laparoscopic surgical scars.  Flat, soft, nontender, nondistended, active bowel sounds.  No rebound, guarding. Back: No CVAT.  No paralumbar, L-spine, SI joint, sacral tenderness Rectal: Normal external appearance.  Normal rectal tone.  Small amount of hard stool in rectal vault.  Chaperone present during exam skin: No rash, skin intact Musculoskeletal: no deformities Neurologic: Alert & oriented x 3, no focal neuro deficits Psychiatric: Speech and behavior appropriate   ED Course   Medications - No data to display  Orders Placed This Encounter  Procedures   DG Abd 1 View    Standing Status:   Standing    Number of Occurrences:   1    Reason for Exam (SYMPTOM  OR DIAGNOSIS REQUIRED):   Constipation for 4 days.  Abdomen benign    No results found for this or any previous visit (from the past  24 hours). DG Abd 1 View Result Date: 03/10/2024 CLINICAL DATA:  Constipation and low back pain. EXAM: ABDOMEN - 1 VIEW COMPARISON:  CT abdomen pelvis with IV contrast 09/07/2021. FINDINGS: The bowel gas pattern is normal and there is a normal stool volume. No radio-opaque calculi or other significant radiographic abnormality are seen. There is no supine evidence for free air.  Lung bases are clear. IMPRESSION: Negative. Electronically Signed   By: Francis Quam M.D.   On: 03/10/2024 20:06    ED Clinical Impression  1. Constipation,  unspecified constipation type      ED Assessment/Plan    Patient presents with acute illness with systemic symptoms of tachycardia.  Patient's abdomen is benign.  No evidence of obstruction, perforation.  Doubt obstipation in the absence of nausea, vomiting.  No evidence of impaction.  Will check a KUB.  Home with MiraLAX, Dulcolax, increase fluids to 2 L of water per day, Metamucil fiber supplements.  Follow-up with PCP as needed.  ER return precautions given.  Reviewed imaging independently.  Normal stool burden per radiology.  See radiology report for full details.  Discussed imaging, MDM, treatment plan, and plan for follow-up with patient. Discussed sn/sx that should prompt return to the ED. patient agrees with plan.   No orders of the defined types were placed in this encounter.     *This clinic note was created using Dragon dictation software. Therefore, there may be occasional mistakes despite careful proofreading.  ?    Van Knee, MD 03/10/24 2009

## 2024-03-10 NOTE — Discharge Instructions (Addendum)
 You have a lot of stool on your x-ray. I did not see anything worrisome on your abdominal x-ray.  We will contact you only if the radiology overread differs enough from mine and we need to change management  1-2 capfuls of MiraLAX per day.  I would also suggest Dulcolax, Metamucil fiber supplements.  Increase your fruit and vegetable intake.  You need to be drinking at least 64 ounces / 2 L of noncaffeinated, nonsugary, nonalcoholic beverages per day.  This can be plain water, flavored water, etc.  Drink extra fluids. It may take up to 3 days for the miralax to take effect. may also drink prune and apple juice. Return if you have a fever, if you start having severe abdominal pain, a fever >100.4, or any other concerns.    Go to www.goodrx.com  or www.costplusdrugs.com to look up your medications. This will give you a list of where you can find your prescriptions at the most affordable prices. Or ask the pharmacist what the cash price is, or if they have any other discount programs available to help make your medication more affordable. This can be less expensive than what you would pay with insurance.

## 2024-03-10 NOTE — ED Triage Notes (Signed)
 Pt c/o constipation, anal pain, lower back pain x4days  Pt states that his butthole feels raw and he can not pinpoint the pain he is experiencing.  Pt states that his bowel movements do not feel complete, and he only has small nuggets compared to normal  Pt denies any changes of diet or new medications.   Pt denies rectal pain, bleeding and describes feeling as wiping too much  Pt denies anal sex or anal insertions  Pt states that the back pain is along the tailbone  Pt denies bruising along the back or buttocks.

## 2024-07-10 ENCOUNTER — Ambulatory Visit
Admission: EM | Admit: 2024-07-10 | Discharge: 2024-07-10 | Disposition: A | Discharge status: Home / Self Care | Attending: Physician Assistant | Admitting: Physician Assistant

## 2024-07-10 ENCOUNTER — Ambulatory Visit

## 2024-07-10 ENCOUNTER — Encounter: Payer: Self-pay | Admitting: Emergency Medicine

## 2024-07-10 DIAGNOSIS — M25571 Pain in right ankle and joints of right foot: Secondary | ICD-10-CM

## 2024-07-10 DIAGNOSIS — S93401A Sprain of unspecified ligament of right ankle, initial encounter: Secondary | ICD-10-CM | POA: Diagnosis not present

## 2024-07-10 NOTE — ED Triage Notes (Signed)
 Pt c/o right ankle pain. He states he was playing basketball and rolled it or someone stepped on it. He has been able to put some weight on it with the help of others. He has swelling.

## 2024-07-10 NOTE — ED Provider Notes (Signed)
 " [facility]    CSN: [id number] Arrival date & time: 07/10/24  1708      History   Chief Complaint Chief Complaint  Patient presents with   Ankle Pain    right    HPI ARRAN FESSEL is a 24 y.o. male presenting for right ankle pain and swelling following an accidental fall and inversion injury that occurred today when he fell while playing basketball outside.  He reports increased pain when trying to bear weight.  He denies any weakness.  He has taken ibuprofen , applied ice for the past 3 hours and have it elevated.  Concern for possible underlying fracture or sprain.  HPI  Past Medical History:  Diagnosis Date   ADD (attention deficit disorder)     Patient Active Problem List   Diagnosis Date Noted   Acute appendicitis 09/07/2021    Past Surgical History:  Procedure Laterality Date   APPENDECTOMY  10/08/2021   XI ROBOTIC LAPAROSCOPIC ASSISTED APPENDECTOMY N/A 09/07/2021   Procedure: XI ROBOTIC LAPAROSCOPIC ASSISTED APPENDECTOMY;  Surgeon: Tye Millet, DO;  Location: [facility] OR[facility];  Service: General;  Laterality: N/A;       Home Medications    Prior to Admission medications  Medication Sig Start Date End Date Taking? Authorizing Provider  escitalopram (LEXAPRO) 10 MG tablet Take 10 mg by mouth daily. 12/21/21   [provider]  hydrOXYzine (ATARAX) 25 MG tablet Take 25 mg by mouth 3 (three) times daily as needed for anxiety.    [provider]  mupirocin  ointment (BACTROBAN ) 2 % Apply 1 Application topically 2 (two) times daily. 01/22/24   Bernardino Ditch, NP  propranolol (INDERAL) 10 MG tablet Take 10 mg by mouth 2 (two) times daily as needed. 12/23/21   [provider]    Family History History reviewed. No pertinent family history.  Social History Social History[1]   Allergies   Patient has no known allergies.   Review of Systems Review of Systems  Musculoskeletal:  Positive for arthralgias, gait problem and joint swelling.   Skin:  Negative for color change and wound.  Neurological:  Negative for weakness and numbness.     Physical Exam Triage Vital Signs ED Triage Vitals  Encounter Vitals Group     BP 07/10/24 1728 125/68     Girls Systolic BP Percentile --      Girls Diastolic BP Percentile --      Boys Systolic BP Percentile --      Boys Diastolic BP Percentile --      Pulse Rate 07/10/24 1728 (!) 106     Resp 07/10/24 1728 16     Temp 07/10/24 1728 99.6 F (37.6 C)     Temp Source 07/10/24 1728 Oral     SpO2 07/10/24 1728 100 %     Weight 07/10/24 1727 141 lb 1.5 oz (64 kg)     Height 07/10/24 1727 5' 5 (1.651 m)     Head Circumference --      Peak Flow --      Pain Score 07/10/24 1727 6     Pain Loc --      Pain Education --      Exclude from Growth Chart --    No data found.  Updated Vital Signs BP 125/68 (BP Location: Left Arm)   Pulse (!) 106   Temp 99.6 F (37.6 C) (Oral)   Resp 16   Ht 5' 5 (1.651 m)   Wt 141 lb 1.5  oz (64 kg)   SpO2 100%   BMI 23.48 kg/m       Physical Exam Vitals and nursing note reviewed.  Constitutional:      General: He is not in acute distress.    Appearance: Normal appearance. He is well-developed. He is not ill-appearing.  HENT:     Head: Normocephalic and atraumatic.  Eyes:     Conjunctiva/sclera: Conjunctivae normal.  Cardiovascular:     Rate and Rhythm: Normal rate.  Pulmonary:     Effort: Pulmonary effort is normal. No respiratory distress.  Musculoskeletal:     Cervical back: Neck supple.     Right ankle: Swelling present. No ecchymosis or lacerations. Tenderness present over the lateral malleolus and ATF ligament. Decreased range of motion.  Skin:    General: Skin is warm and dry.     Capillary Refill: Capillary refill takes less than 2 seconds.  Neurological:     General: No focal deficit present.     Mental Status: He is alert. Mental status is at baseline.     Motor: No weakness.     Gait: Gait abnormal.  Psychiatric:         Mood and Affect: Mood normal.        Behavior: Behavior normal.      UC Treatments / Results  Labs (all labs ordered are listed, but only abnormal results are displayed) Labs Reviewed - No data to display  EKG   Radiology DG Foot Complete Right Result Date: 07/10/2024 CLINICAL DATA:  Acute right ankle pain.  Basketball injury. EXAM: RIGHT FOOT COMPLETE - 3+ VIEW COMPARISON:  Right ankle 07/10/2024 FINDINGS: Negative for fracture or dislocation in the right foot. There is a small density or calcification on the tip of the great toe near the skin surface. Soft tissue swelling in the ankle. Normal alignment in the right foot. IMPRESSION: 1. No acute bone abnormality in the right foot. 2. Soft tissue swelling in the ankle. 3. Small density or calcification on the tip of the great toe. Electronically Signed   By: Juliene Balder M.D.   On: 07/10/2024 18:34   DG Ankle Complete Right Result Date: 07/10/2024 CLINICAL DATA:  Acute right ankle pain. EXAM: RIGHT ANKLE - COMPLETE 3+ VIEW COMPARISON:  None Available. FINDINGS: Negative for fracture or dislocation in the right ankle. Normal alignment. Lateral soft tissue swelling. IMPRESSION: 1. No acute bone abnormality to the right ankle. 2. Lateral soft tissue swelling. Electronically Signed   By: Juliene Balder M.D.   On: 07/10/2024 18:31    Procedures Procedures (including critical care time)  Medications Ordered in UC Medications - No data to display  Initial Impression / Assessment and Plan / UC Course  I have reviewed the triage vital signs and the nursing notes.  Pertinent labs & imaging results that were available during my care of the patient were reviewed by me and considered in my medical decision making (see chart for details).   24 year old male presents for right ankle pain and swelling following an accidental fall while playing basketball today.  X-ray of foot and ankle obtained.  Negative.  Ankle sprain.  Patient given supportive  ankle brace.  Advised ibuprofen  and Tylenol  as needed for pain relief and avoiding painful activities.  Reviewed return precautions.   Final Clinical Impressions(s) / UC Diagnoses   Final diagnoses:  Acute right ankle pain  Sprain of right ankle, unspecified ligament, initial encounter     Discharge Instructions      -  No fractures.   SPRAIN: Stressed avoiding painful activities . Reviewed RICE guidelines. Use medications as directed, including NSAIDs. If no NSAIDs have been prescribed for you today, you may take Aleve or Motrin  over the counter. May use Tylenol  in between doses of NSAIDs.  If no improvement in the next 1-2 weeks, f/u with PCP or return to our office for reexamination, and please feel free to call or return at any time for any questions or concerns you may have and we will be happy to help you!         ED Prescriptions   None    PDMP not reviewed this encounter.     [1]  Social History Tobacco Use   Smoking status: Never   Smokeless tobacco: Never  Vaping Use   Vaping status: Never Used  Substance Use Topics   Alcohol use: No   Drug use: Never     Arvis Jolan KATHEE DEVONNA 07/10/24 1905  "

## 2024-07-10 NOTE — Discharge Instructions (Signed)
-  No  fractures!  SPRAIN: Stressed avoiding painful activities . Reviewed RICE guidelines. Use medications as directed, including NSAIDs. If no NSAIDs have been prescribed for you today, you may take Aleve or Motrin over the counter. May use Tylenol in between doses of NSAIDs.  If no improvement in the next 1-2 weeks, f/u with PCP or return to our office for reexamination, and please feel free to call or return at any time for any questions or concerns you may have and we will be happy to help you!
